# Patient Record
Sex: Female | Born: 1965 | Race: White | Hispanic: No | Marital: Married | Smoking: Never smoker
Health system: Southern US, Community
[De-identification: ages and names within clinical notes are randomized; demographics above are authoritative.]

## PROBLEM LIST (undated history)

## (undated) DIAGNOSIS — Z923 Personal history of irradiation: Secondary | ICD-10-CM

## (undated) DIAGNOSIS — H16003 Unspecified corneal ulcer, bilateral: Secondary | ICD-10-CM

## (undated) DIAGNOSIS — M1990 Unspecified osteoarthritis, unspecified site: Secondary | ICD-10-CM

## (undated) DIAGNOSIS — R7989 Other specified abnormal findings of blood chemistry: Secondary | ICD-10-CM

## (undated) DIAGNOSIS — Z87442 Personal history of urinary calculi: Secondary | ICD-10-CM

## (undated) HISTORY — PX: CHOLECYSTECTOMY: SHX55

## (undated) HISTORY — PX: WISDOM TOOTH EXTRACTION: SHX21

## (undated) HISTORY — PX: ABDOMINAL SURGERY: SHX537

---

## 1997-10-18 ENCOUNTER — Inpatient Hospital Stay (HOSPITAL_COMMUNITY): Admission: AD | Admit: 1997-10-18 | Discharge: 1997-10-18 | Payer: Self-pay | Admitting: Obstetrics and Gynecology

## 1997-10-19 ENCOUNTER — Inpatient Hospital Stay (HOSPITAL_COMMUNITY): Admission: AD | Admit: 1997-10-19 | Discharge: 1997-10-21 | Payer: Self-pay | Admitting: *Deleted

## 1997-12-01 ENCOUNTER — Other Ambulatory Visit: Admission: RE | Admit: 1997-12-01 | Discharge: 1997-12-01 | Payer: Self-pay | Admitting: *Deleted

## 1998-12-06 ENCOUNTER — Other Ambulatory Visit: Admission: RE | Admit: 1998-12-06 | Discharge: 1998-12-06 | Payer: Self-pay | Admitting: Obstetrics and Gynecology

## 1999-12-18 ENCOUNTER — Other Ambulatory Visit: Admission: RE | Admit: 1999-12-18 | Discharge: 1999-12-18 | Payer: Self-pay | Admitting: *Deleted

## 2001-01-16 ENCOUNTER — Other Ambulatory Visit: Admission: RE | Admit: 2001-01-16 | Discharge: 2001-01-16 | Payer: Self-pay | Admitting: Obstetrics & Gynecology

## 2002-02-27 ENCOUNTER — Other Ambulatory Visit: Admission: RE | Admit: 2002-02-27 | Discharge: 2002-02-27 | Payer: Self-pay | Admitting: Obstetrics & Gynecology

## 2003-04-07 ENCOUNTER — Other Ambulatory Visit: Admission: RE | Admit: 2003-04-07 | Discharge: 2003-04-07 | Payer: Self-pay | Admitting: Obstetrics & Gynecology

## 2004-04-25 ENCOUNTER — Other Ambulatory Visit: Admission: RE | Admit: 2004-04-25 | Discharge: 2004-04-25 | Payer: Self-pay | Admitting: Obstetrics & Gynecology

## 2005-06-14 ENCOUNTER — Other Ambulatory Visit: Admission: RE | Admit: 2005-06-14 | Discharge: 2005-06-14 | Payer: Self-pay | Admitting: Obstetrics & Gynecology

## 2011-12-04 ENCOUNTER — Other Ambulatory Visit: Payer: Self-pay | Admitting: Obstetrics & Gynecology

## 2011-12-04 DIAGNOSIS — R928 Other abnormal and inconclusive findings on diagnostic imaging of breast: Secondary | ICD-10-CM

## 2011-12-11 ENCOUNTER — Ambulatory Visit
Admission: RE | Admit: 2011-12-11 | Discharge: 2011-12-11 | Disposition: A | Discharge status: Home / Self Care | Payer: BC Managed Care – PPO | Source: Ambulatory Visit | Attending: Obstetrics & Gynecology | Admitting: Obstetrics & Gynecology

## 2011-12-11 DIAGNOSIS — R928 Other abnormal and inconclusive findings on diagnostic imaging of breast: Secondary | ICD-10-CM

## 2017-01-01 ENCOUNTER — Other Ambulatory Visit: Payer: Self-pay | Admitting: Obstetrics & Gynecology

## 2017-01-01 DIAGNOSIS — R928 Other abnormal and inconclusive findings on diagnostic imaging of breast: Secondary | ICD-10-CM

## 2017-01-04 ENCOUNTER — Other Ambulatory Visit: Payer: Self-pay

## 2017-01-04 ENCOUNTER — Ambulatory Visit
Admission: RE | Admit: 2017-01-04 | Discharge: 2017-01-04 | Disposition: A | Discharge status: Home / Self Care | Payer: BLUE CROSS/BLUE SHIELD | Source: Ambulatory Visit | Attending: Obstetrics & Gynecology | Admitting: Obstetrics & Gynecology

## 2017-01-04 DIAGNOSIS — R928 Other abnormal and inconclusive findings on diagnostic imaging of breast: Secondary | ICD-10-CM

## 2018-11-02 ENCOUNTER — Encounter (HOSPITAL_COMMUNITY): Payer: Self-pay | Admitting: *Deleted

## 2018-11-02 ENCOUNTER — Other Ambulatory Visit: Payer: Self-pay

## 2018-11-02 ENCOUNTER — Emergency Department (HOSPITAL_COMMUNITY)
Admission: EM | Admit: 2018-11-02 | Discharge: 2018-11-02 | Disposition: A | Discharge status: Home / Self Care | Payer: Commercial Managed Care - PPO | Attending: Emergency Medicine | Admitting: Emergency Medicine

## 2018-11-02 ENCOUNTER — Emergency Department (HOSPITAL_COMMUNITY): Payer: Commercial Managed Care - PPO

## 2018-11-02 DIAGNOSIS — Z79899 Other long term (current) drug therapy: Secondary | ICD-10-CM | POA: Diagnosis not present

## 2018-11-02 DIAGNOSIS — N201 Calculus of ureter: Secondary | ICD-10-CM | POA: Diagnosis not present

## 2018-11-02 DIAGNOSIS — R1031 Right lower quadrant pain: Secondary | ICD-10-CM | POA: Diagnosis present

## 2018-11-02 DIAGNOSIS — N23 Unspecified renal colic: Secondary | ICD-10-CM

## 2018-11-02 LAB — CBC WITH DIFFERENTIAL/PLATELET
Abs Immature Granulocytes: 0.03 10*3/uL (ref 0.00–0.07)
Basophils Absolute: 0 10*3/uL (ref 0.0–0.1)
Basophils Relative: 0 %
Eosinophils Absolute: 0 10*3/uL (ref 0.0–0.5)
Eosinophils Relative: 0 %
HCT: 42.2 % (ref 36.0–46.0)
Hemoglobin: 13.8 g/dL (ref 12.0–15.0)
Immature Granulocytes: 0 %
Lymphocytes Relative: 11 %
Lymphs Abs: 1.3 10*3/uL (ref 0.7–4.0)
MCH: 30.1 pg (ref 26.0–34.0)
MCHC: 32.7 g/dL (ref 30.0–36.0)
MCV: 92.1 fL (ref 80.0–100.0)
Monocytes Absolute: 0.5 10*3/uL (ref 0.1–1.0)
Monocytes Relative: 4 %
Neutro Abs: 9.8 10*3/uL — ABNORMAL HIGH (ref 1.7–7.7)
Neutrophils Relative %: 85 %
Platelets: 205 10*3/uL (ref 150–400)
RBC: 4.58 MIL/uL (ref 3.87–5.11)
RDW: 12.4 % (ref 11.5–15.5)
WBC: 11.7 10*3/uL — ABNORMAL HIGH (ref 4.0–10.5)
nRBC: 0 % (ref 0.0–0.2)

## 2018-11-02 LAB — COMPREHENSIVE METABOLIC PANEL
ALT: 16 U/L (ref 0–44)
AST: 23 U/L (ref 15–41)
Albumin: 4.5 g/dL (ref 3.5–5.0)
Alkaline Phosphatase: 40 U/L (ref 38–126)
Anion gap: 10 (ref 5–15)
BUN: 16 mg/dL (ref 6–20)
CO2: 24 mmol/L (ref 22–32)
Calcium: 9.3 mg/dL (ref 8.9–10.3)
Chloride: 106 mmol/L (ref 98–111)
Creatinine, Ser: 1.17 mg/dL — ABNORMAL HIGH (ref 0.44–1.00)
GFR calc Af Amer: >60 mL/min (ref >60)
GFR calc non Af Amer: 54 mL/min — ABNORMAL LOW (ref >60)
Glucose, Bld: 144 mg/dL — ABNORMAL HIGH (ref 70–99)
Potassium: 3.6 mmol/L (ref 3.5–5.1)
Sodium: 140 mmol/L (ref 135–145)
Total Bilirubin: 0.4 mg/dL (ref 0.3–1.2)
Total Protein: 7.1 g/dL (ref 6.5–8.1)

## 2018-11-02 LAB — URINALYSIS, ROUTINE W REFLEX MICROSCOPIC
Bilirubin Urine: NEGATIVE
Glucose, UA: NEGATIVE mg/dL
Hgb urine dipstick: NEGATIVE
Ketones, ur: 5 mg/dL — AB
Leukocytes,Ua: NEGATIVE
Nitrite: NEGATIVE
Protein, ur: NEGATIVE mg/dL
Specific Gravity, Urine: 1.008 (ref 1.005–1.030)
pH: 6 (ref 5.0–8.0)

## 2018-11-02 LAB — I-STAT BETA HCG BLOOD, ED (MC, WL, AP ONLY): I-stat hCG, quantitative: <5 m[IU]/mL (ref <5)

## 2018-11-02 MED ORDER — KETOROLAC TROMETHAMINE 30 MG/ML IJ SOLN
30.0000 mg | Freq: Once | INTRAMUSCULAR | Status: AC
  Administered 2018-11-02: 04:00:00 30 mg via INTRAVENOUS
  Filled 2018-11-02: qty 1

## 2018-11-02 MED ORDER — ONDANSETRON HCL 4 MG/2ML IJ SOLN
4.0000 mg | Freq: Once | INTRAMUSCULAR | Status: AC
  Administered 2018-11-02: 02:00:00 4 mg via INTRAVENOUS
  Filled 2018-11-02: qty 2

## 2018-11-02 MED ORDER — HYDROCODONE-ACETAMINOPHEN 5-325 MG PO TABS: 1.0000 | ORAL_TABLET | Freq: Four times a day (QID) | ORAL | 0 refills | Status: DC | PRN

## 2018-11-02 MED ORDER — SODIUM CHLORIDE 0.9 % IV BOLUS (SEPSIS)
1000.0000 mL | Freq: Once | INTRAVENOUS | Status: AC
  Administered 2018-11-02: 1000 mL via INTRAVENOUS

## 2018-11-02 MED ORDER — FENTANYL CITRATE (PF) 100 MCG/2ML IJ SOLN
100.0000 ug | Freq: Once | INTRAMUSCULAR | Status: AC
  Administered 2018-11-02: 02:00:00 100 ug via INTRAVENOUS
  Filled 2018-11-02: qty 2

## 2018-11-02 NOTE — ED Provider Notes (Signed)
[facility] EMERGENCY DEPARTMENT Provider Note   CSN: [id number] Arrival date & time: 11/02/18  0120    History   Chief Complaint Chief Complaint  Patient presents with   Flank Pain    HPI Jennifer Cantu is a 53 y.o. female.     The history is provided by the patient.  Flank Pain  This is a new problem. The problem occurs constantly. The problem has been rapidly worsening. Pertinent negatives include no chest pain and no abdominal pain. Exacerbated by: position. Nothing relieves the symptoms. She has tried acetaminophen for the symptoms. The treatment provided no relief.  PT Presents with acute onset of right flank pain that started several hours ago.  Denies trauma.  She does not recall having this pain previously.  No fevers.  No chest pain or cough.  No Urinary symptoms reported   PMH-none Past Surgical History:  Procedure Laterality Date   ABDOMINAL SURGERY       OB History   No obstetric history on file.      Home Medications    Prior to Admission medications   Medication Sig Start Date End Date Taking? Authorizing Provider  ALPRAZolam Duanne Moron) 0.5 MG tablet  08/21/18   [provider]  PREMARIN 0.9 MG tablet  10/14/18   [provider]    Family History No family history on file.  Social History Social History   Tobacco Use   Smoking status: Never Smoker   Smokeless tobacco: Never Used  Substance Use Topics   Alcohol use: Not on file   Drug use: Not on file     Allergies   Patient has no known allergies.   Review of Systems Review of Systems  Constitutional: Negative for fever.  Cardiovascular: Negative for chest pain.  Gastrointestinal: Negative for abdominal pain.  Genitourinary: Positive for flank pain.  All other systems reviewed and are negative.    Physical Exam Updated Vital Signs BP 135/90    Pulse 85    Temp 97.7 F (36.5 C) (Oral)    Resp 20    Ht 1.626 m (5\' 4" )    Wt 56.2 kg    SpO2 100%    BMI 21.28  kg/m   Physical Exam  CONSTITUTIONAL: Well developed/well nourished, uncomfortable appearing  HEAD: Normocephalic/atraumatic EYES: EOMI/PERRL ENMT: Mucous membranes moist NECK: supple no meningeal signs SPINE/BACK:entire spine nontender CV: S1/S2 noted, no murmurs/rubs/gallops noted LUNGS: Lungs are clear to auscultation bilaterally, no apparent distress ABDOMEN: soft, nontender, no rebound or guarding, bowel sounds noted throughout abdomen LK:GMWNU cva tenderness NEURO: Pt is awake/alert/appropriate, moves all extremitiesx4.  No facial droop.   EXTREMITIES: pulses normal/equal, full ROM SKIN: warm, color normal PSYCH: no abnormalities of mood noted, alert and oriented to situation  ED Treatments / Results  Labs (all labs ordered are listed, but only abnormal results are displayed) Labs Reviewed  URINALYSIS, ROUTINE W REFLEX MICROSCOPIC - Abnormal; Notable for the following components:      Result Value   Color, Urine STRAW (*)    Ketones, ur 5 (*)    All other components within normal limits  CBC WITH DIFFERENTIAL/PLATELET - Abnormal; Notable for the following components:   WBC 11.7 (*)    Neutro Abs 9.8 (*)    All other components within normal limits  COMPREHENSIVE METABOLIC PANEL - Abnormal; Notable for the following components:   Glucose, Bld 144 (*)    Creatinine, Ser 1.17 (*)    GFR calc non Af Amer 54 (*)  All other components within normal limits  I-STAT BETA HCG BLOOD, ED ([facility], [facility], [facility] ONLY)    EKG None  Radiology Ct Renal Stone Study  Result Date: 11/02/2018 CLINICAL DATA:  Right side flank pain. EXAM: CT ABDOMEN AND PELVIS WITHOUT CONTRAST TECHNIQUE: Multidetector CT imaging of the abdomen and pelvis was performed following the standard protocol without IV contrast. COMPARISON:  None. FINDINGS: Lower chest: Lung bases are clear. No effusions. Heart is normal size. Hepatobiliary: 1.5 cm cyst in the left hepatic lobe. Prior cholecystectomy. Pancreas: No focal  abnormality or ductal dilatation. Spleen: No focal abnormality.  Normal size. Adrenals/Urinary Tract: Mild right hydronephrosis and hydroureter due to 2-3 mm distal right ureteral stone. No stones or hydronephrosis on the left. Adrenal glands and urinary bladder unremarkable. Stomach/Bowel: Normal appendix. Stomach, large and small bowel grossly unremarkable. Vascular/Lymphatic: No evidence of aneurysm or adenopathy. Reproductive: IUD noted in the uterus. Uterus and adnexa unremarkable. No mass. Other: No free fluid or free air. Musculoskeletal: No acute bony abnormality. IMPRESSION: 2-3 mm distal right ureteral stone with mild right hydronephrosis. Electronically Signed   By: Rolm Baptise M.D.   On: 11/02/2018 03:37    Procedures Procedures    Medications Ordered in ED Medications  fentaNYL (SUBLIMAZE) injection 100 mcg (100 mcg Intravenous Given 11/02/18 0228)  ondansetron (ZOFRAN) injection 4 mg (4 mg Intravenous Given 11/02/18 0228)  sodium chloride 0.9 % bolus 1,000 mL (0 mLs Intravenous Stopped 11/02/18 0346)  ketorolac (TORADOL) 30 MG/ML injection 30 mg (30 mg Intravenous Given 11/02/18 0409)     Initial Impression / Assessment and Plan / ED Course  I have reviewed the triage vital signs and the nursing notes.  Pertinent labs results that were available during my care of the patient were reviewed by me and considered in my medical decision making (see chart for details).        3:04 AM PT Presents for right flank pain.  She did not respond to IV pain medicines.  Will obtain CT imaging to evaluate for ureteral stone 5:10 AM PT Improved.  Found to have a right ureteral stone.  She responded to Toradol.  She is requesting discharge home. She declines pain medications.  She will try ibuprofen at home  Final Clinical Impressions(s) / ED Diagnoses   Final diagnoses:  Ureteral colic    ED Discharge Orders    None       Ripley Fraise, MD 11/02/18 [phone]

## 2018-11-02 NOTE — ED Triage Notes (Signed)
Pt c/o right side flank pain with n/v that started yesterday, denies any urinary symptoms,

## 2018-11-24 ENCOUNTER — Other Ambulatory Visit: Payer: Self-pay | Admitting: Urology

## 2018-11-24 ENCOUNTER — Other Ambulatory Visit (HOSPITAL_COMMUNITY): Payer: Commercial Managed Care - PPO

## 2018-11-24 ENCOUNTER — Other Ambulatory Visit: Payer: Self-pay

## 2018-11-24 ENCOUNTER — Encounter (HOSPITAL_COMMUNITY)
Admission: RE | Admit: 2018-11-24 | Discharge: 2018-11-24 | Disposition: A | Discharge status: Home / Self Care | Payer: Commercial Managed Care - PPO | Source: Ambulatory Visit

## 2018-11-24 ENCOUNTER — Other Ambulatory Visit (HOSPITAL_COMMUNITY)
Admission: RE | Admit: 2018-11-24 | Discharge: 2018-11-24 | Disposition: A | Discharge status: Home / Self Care | Payer: Commercial Managed Care - PPO | Source: Ambulatory Visit | Attending: Urology | Admitting: Urology

## 2018-11-24 ENCOUNTER — Encounter (HOSPITAL_COMMUNITY): Payer: Self-pay

## 2018-11-24 DIAGNOSIS — Z1159 Encounter for screening for other viral diseases: Secondary | ICD-10-CM | POA: Insufficient documentation

## 2018-11-24 DIAGNOSIS — Z79899 Other long term (current) drug therapy: Secondary | ICD-10-CM | POA: Diagnosis not present

## 2018-11-24 DIAGNOSIS — Z01812 Encounter for preprocedural laboratory examination: Secondary | ICD-10-CM | POA: Insufficient documentation

## 2018-11-24 DIAGNOSIS — N132 Hydronephrosis with renal and ureteral calculous obstruction: Secondary | ICD-10-CM | POA: Diagnosis present

## 2018-11-24 DIAGNOSIS — N3289 Other specified disorders of bladder: Secondary | ICD-10-CM | POA: Diagnosis not present

## 2018-11-24 HISTORY — DX: Other specified abnormal findings of blood chemistry: R79.89

## 2018-11-24 HISTORY — DX: Unspecified corneal ulcer, bilateral: H16.003

## 2018-11-24 HISTORY — DX: Personal history of urinary calculi: Z87.442

## 2018-11-24 HISTORY — DX: Unspecified osteoarthritis, unspecified site: M19.90

## 2018-11-24 LAB — BASIC METABOLIC PANEL
Anion gap: 7 (ref 5–15)
BUN: 13 mg/dL (ref 6–20)
CO2: 28 mmol/L (ref 22–32)
Calcium: 8.9 mg/dL (ref 8.9–10.3)
Chloride: 103 mmol/L (ref 98–111)
Creatinine, Ser: 1.27 mg/dL — ABNORMAL HIGH (ref 0.44–1.00)
GFR calc Af Amer: 57 mL/min — ABNORMAL LOW (ref >60)
GFR calc non Af Amer: 49 mL/min — ABNORMAL LOW (ref >60)
Glucose, Bld: 97 mg/dL (ref 70–99)
Potassium: 4.5 mmol/L (ref 3.5–5.1)
Sodium: 138 mmol/L (ref 135–145)

## 2018-11-24 LAB — CBC
HCT: 44.4 % (ref 36.0–46.0)
Hemoglobin: 14.5 g/dL (ref 12.0–15.0)
MCH: 30.4 pg (ref 26.0–34.0)
MCHC: 32.7 g/dL (ref 30.0–36.0)
MCV: 93.1 fL (ref 80.0–100.0)
Platelets: 214 10*3/uL (ref 150–400)
RBC: 4.77 MIL/uL (ref 3.87–5.11)
RDW: 12.2 % (ref 11.5–15.5)
WBC: 10.5 10*3/uL (ref 4.0–10.5)
nRBC: 0 % (ref 0.0–0.2)

## 2018-11-24 LAB — HCG, SERUM, QUALITATIVE: Preg, Serum: NEGATIVE

## 2018-11-24 LAB — SARS CORONAVIRUS 2 BY RT PCR (HOSPITAL ORDER, PERFORMED IN ~~LOC~~ HOSPITAL LAB): SARS Coronavirus 2: NEGATIVE

## 2018-11-24 NOTE — Progress Notes (Signed)
Called pharmacy due to surgery schedule for 11-25-2018 saying medication reconcilitation in process, hospital pharmcy stated med rec was done and pharmacy tech did not need to come

## 2018-11-24 NOTE — H&P (Signed)
CC: I have a ureteral stone.  HPI: Jennifer Cantu is a 53 year-old female patient who is here for a ureteral calculus.  The patient's stone was on her right side. She first noticed the symptoms 11/02/2018.   11/24/18: The patient was seen in the emergency room on 11/02/18 with progressively worsening flank pain with no radiation. It was worsened by positional change but not improved and was located on the right side having started just prior to being seen. She reported no urinary symptoms. A CT scan revealed a 2 mm wide stone just above the intramural ureter on the right-hand side with no right or left renal calculi. She returns now with worsening symptoms since Friday morning. The pain has been intermittent and was bad for the last 24 hours. She has nausea with vomiting. She has had no hematuria. She had some urgency last night with small voids. She has no fever.      ALLERGIES: No Known Drug Allergies    MEDICATIONS: ALPRAZOLAM PO Daily  Hydrocodone-Acetaminophen  PREMARIN PO Daily     GU PSH: None   NON-GU PSH: Cholecystectomy (laparoscopic)    GU PMH: None   NON-GU PMH: None   FAMILY HISTORY: 1 son - Other Diabetes - Cousin Heart Disease - Mother High Blood Pressure - Mother   SOCIAL HISTORY: Marital Status: Married Preferred Language: English; Race: White Current Smoking Status: Patient has never smoked.  Does not use smokeless tobacco. Has never drank.  Does not use drugs. Drinks 1 caffeinated drink per day.    REVIEW OF SYSTEMS:    GU Review Female:   Patient reports frequent urination, get up at night to urinate, and stream starts and stops. Patient denies leakage of urine, hard to postpone urination, have to strain to urinate, being pregnant, trouble starting your stream, and burning /pain with urination.  Gastrointestinal (Upper):   Patient reports nausea and vomiting. Patient denies indigestion/ heartburn.  Gastrointestinal (Lower):   Patient reports constipation.  Patient denies diarrhea.  Constitutional:   Patient denies fever, night sweats, weight loss, and fatigue.  Skin:   Patient denies skin rash/ lesion and itching.  Eyes:   Patient denies blurred vision and double vision.  Ears/ Nose/ Throat:   Patient denies sore throat and sinus problems.  Hematologic/Lymphatic:   Patient denies swollen glands and easy bruising.  Cardiovascular:   Patient denies leg swelling and chest pains.  Respiratory:   Patient denies cough and shortness of breath.  Endocrine:   Patient denies excessive thirst.  Musculoskeletal:   Patient reports back pain. Patient denies joint pain.  Neurological:   Patient reports headaches. Patient denies dizziness.  Psychologic:   Patient denies depression and anxiety.   Notes: hematuria weak stream    VITAL SIGNS:      11/24/2018 10:13 AM  Weight 123 lb / 55.79 kg  Height 64 in / 162.56 cm  BP 114/71 mmHg  Heart Rate 72 /min  Temperature 98.1 F / 36.7 C  BMI 21.1 kg/m   MULTI-SYSTEM PHYSICAL EXAMINATION:    Constitutional: Well-nourished. No physical deformities. Normally developed. Good grooming.  Neck: Neck symmetrical, not swollen. Normal tracheal position.  Respiratory: Normal breath sounds. No labored breathing, no use of accessory muscles.   Cardiovascular: Regular rate and rhythm. No murmur, no gallop.   Lymphatic: No enlargement, no tenderness of supraclavicular or, neck lymph nodes.  Skin: No paleness, no jaundice, no cyanosis. No lesion, no ulcer, no rash.  Neurologic / Psychiatric: Oriented to time,  oriented to place, oriented to person. No depression, no anxiety, no agitation.  Gastrointestinal: Abdominal tenderness in RLQ and CVA. No mass, no rigidity, non obese abdomen.   Musculoskeletal: Normal gait and station of head and neck.     PAST DATA REVIEWED:  Source Of History:  Patient, Outside Source  Lab Test Review:   BUN/Creatinine, Calcium  Records Review:   Previous Hospital Records  Urine Test  Review:   Urinalysis  X-Ray Review: C.T. Abdomen/Pelvis: Reviewed Films. Reviewed Report. Her CT scan revealed mild right hydronephrosis.   Notes:                     On 11/02/18 her creatinine was noted to be 1.17, her serum calcium was normal at 9.3 and her urinalysis was clear.   PROCEDURES:         KUB - [id number]  A single view of the abdomen is obtained.      Patient confirmed No Neulasta OnPro Device.           Urinalysis w/Scope Dipstick Dipstick Cont'd Micro  Color: Yellow Bilirubin: Neg mg/dL WBC/hpf: 0 - 5/hpf  Appearance: Clear Ketones: Trace mg/dL RBC/hpf: 3 - 10/hpf  Specific Gravity: 1.020 Blood: 2+ ery/uL Bacteria: NS (Not Seen)  pH: 6.0 Protein: Neg mg/dL Cystals: NS (Not Seen)  Glucose: Neg mg/dL Urobilinogen: 0.2 mg/dL Casts: NS (Not Seen)    Nitrites: Neg Trichomonas: Not Present    Leukocyte Esterase: Trace leu/uL Mucous: Not Present      Epithelial Cells: 0 - 5/hpf      Yeast: NS (Not Seen)      Sperm: Not Present         Ketoralac 17m - [id number]: 30 Adm. By: BRobynn Pane Unit: mg Lot No [id number] Route: IM Exp. Date 12/08/2019  Freq: None Mfgr.:   Site: Right Buttock   ASSESSMENT:      ICD-10 Details  1 GU:   Ureteral calculus - N20.1 Right, She has a 2-399mright distal stone with no progression over 3 weeks. I discussed continued MET with the addition of tamsulosin vs URS. I think the stone is too small to consider ESWL. She got relief with the Toradol so I will get her on the schedule tomorrow for URS. I have reviewed the risks of ureteroscopy including bleeding, infection, ureteral injury, need for a stent or secondary procedures, thrombotic events and anesthetic complications.    PLAN:           Orders X-Rays: KUB          [facility]: [facility] [phone] Ketoralac 3016mToradol Per 15 Mg) - [id number]       Document Letter(s):  Created for Patient: Clinical Summary         Notes:    CC: Dr. KevRory Percy     Next Appointment:      Next Appointment: 11/25/2018 01:30 PM    Appointment Type: Surgery     Location: [facility] - [phone] Provider: JohIrine Seal.D.    Reason for Visit: OP WL CYSTO RT RTG URS POSS HLL STENT

## 2018-11-24 NOTE — Patient Instructions (Addendum)
Jennifer Cantu    Your procedure is scheduled on: 11-25-2018  Report to [place]  Entrance  Report to admitting at [place] 19 TEST ON TODAY.  Call this number if you have problems the morning of surgery [phone]    Remember: Do not eat food  :After Midnight. CLEAR LIQUIDS FROM MIDNIGHT UNTIL 730 AM. NOTHING BY MOUTH AFTER 730 AM.  BRUSH YOUR TEETH MORNING OF SURGERY AND RINSE YOUR MOUTH OUT, NO CHEWING GUM CANDY OR MINTS.     CLEAR LIQUID DIET   Foods Allowed                                                                     Foods Excluded  Coffee and tea, regular and decaf                             liquids that you cannot  Plain Jell-O in any flavor                                             see through such as: Fruit ices (not with fruit pulp)                                     milk, soups, orange juice  Iced Popsicles                                    All solid food Carbonated beverages, regular and diet                                    Cranberry, grape and apple juices Sports drinks like Gatorade Lightly seasoned clear broth or consume(fat free) Sugar, honey syrup  Sample Menu Breakfast                                Lunch                                     Supper Cranberry juice                    Beef broth                            Chicken broth Jell-O                                     Grape juice  Apple juice Coffee or tea                        Jell-O                                      Popsicle                                                Coffee or tea                        Coffee or tea  _____________________________________________________________________     Take these medicines the morning of surgery with A SIP OF WATER: HYDROCODONE IF NEEDED, EYE DROP              You may not have any metal on your body including hair pins and              piercings  Do not  wear jewelry, make-up, lotions, powders or perfumes, deodorant             Do not wear nail polish.  Do not shave  48 hours prior to surgery.           .   Do not bring valuables to the hospital. [place].  Contacts, dentures or bridgework may not be worn into surgery.  Leave suitcase in the car. After surgery it may be brought to your room.     Patients discharged the day of surgery will not be allowed to drive home. IF YOU ARE HAVING SURGERY AND GOING HOME THE SAME DAY, YOU MUST HAVE AN ADULT TO DRIVE YOU HOME AND BE WITH YOU FOR 24 HOURS. YOU MAY GO HOME BY TAXI OR UBER OR ORTHERWISE, BUT AN ADULT MUST ACCOMPANY YOU HOME AND STAY WITH YOU FOR 24 HOURS.  Name and phone number of your driver:BRIAN LAJUANNA POMPA CELL [phone]  Special Instructions: N/A              Please read over the following fact sheets you were given: _____________________________________________________________________             [facility] - Preparing for Surgery Before surgery, you can play an important role.  Because skin is not sterile, your skin needs to be as free of germs as possible.  You can reduce the number of germs on your skin by washing with CHG (chlorahexidine gluconate) soap before surgery.  CHG is an antiseptic cleaner which kills germs and bonds with the skin to continue killing germs even after washing. Please DO NOT use if you have an allergy to CHG or antibacterial soaps.  If your skin becomes reddened/irritated stop using the CHG and inform your nurse when you arrive at Short Stay. Do not shave (including legs and underarms) for at least 48 hours prior to the first CHG shower.  You may shave your face/neck. Please follow these instructions carefully:  1.  Shower with CHG Soap the night before surgery and the  morning of Surgery.  2.  If you choose to wash your hair, wash your hair first as usual  with your  normal  shampoo.  3.  After you  shampoo, rinse your hair and body thoroughly to remove the  shampoo.                           4.  Use CHG as you would any other liquid soap.  You can apply chg directly  to the skin and wash                       Gently with a scrungie or clean washcloth.  5.  Apply the CHG Soap to your body ONLY FROM THE NECK DOWN.   Do not use on face/ open                           Wound or open sores. Avoid contact with eyes, ears mouth and genitals (private parts).                       Wash face,  Genitals (private parts) with your normal soap.             6.  Wash thoroughly, paying special attention to the area where your surgery  will be performed.  7.  Thoroughly rinse your body with warm water from the neck down.  8.  DO NOT shower/wash with your normal soap after using and rinsing off  the CHG Soap.                9.  Pat yourself dry with a clean towel.            10.  Wear clean pajamas.            11.  Place clean sheets on your bed the night of your first shower and do not  sleep with pets. Day of Surgery : Do not apply any lotions/deodorants the morning of surgery.  Please wear clean clothes to the hospital/surgery center.  FAILURE TO FOLLOW THESE INSTRUCTIONS MAY RESULT IN THE CANCELLATION OF YOUR SURGERY PATIENT SIGNATURE_________________________________  NURSE SIGNATURE__________________________________  ________________________________________________________________________

## 2018-11-25 ENCOUNTER — Ambulatory Visit (HOSPITAL_COMMUNITY): Payer: Commercial Managed Care - PPO

## 2018-11-25 ENCOUNTER — Encounter (HOSPITAL_COMMUNITY)
Admission: RE | Disposition: A | Discharge status: Home / Self Care | Payer: Self-pay | Source: Home / Self Care | Attending: Urology

## 2018-11-25 ENCOUNTER — Ambulatory Visit (HOSPITAL_COMMUNITY)
Admission: RE | Admit: 2018-11-25 | Discharge: 2018-11-25 | Disposition: A | Discharge status: Home / Self Care | Payer: Commercial Managed Care - PPO | Attending: Urology | Admitting: Urology

## 2018-11-25 ENCOUNTER — Encounter (HOSPITAL_COMMUNITY): Payer: Self-pay | Admitting: *Deleted

## 2018-11-25 ENCOUNTER — Ambulatory Visit (HOSPITAL_COMMUNITY): Payer: Commercial Managed Care - PPO | Admitting: Anesthesiology

## 2018-11-25 ENCOUNTER — Ambulatory Visit (HOSPITAL_COMMUNITY): Payer: Commercial Managed Care - PPO | Admitting: Physician Assistant

## 2018-11-25 DIAGNOSIS — N132 Hydronephrosis with renal and ureteral calculous obstruction: Secondary | ICD-10-CM | POA: Diagnosis not present

## 2018-11-25 DIAGNOSIS — N201 Calculus of ureter: Secondary | ICD-10-CM

## 2018-11-25 DIAGNOSIS — Z79899 Other long term (current) drug therapy: Secondary | ICD-10-CM | POA: Insufficient documentation

## 2018-11-25 DIAGNOSIS — N3289 Other specified disorders of bladder: Secondary | ICD-10-CM | POA: Insufficient documentation

## 2018-11-25 DIAGNOSIS — Z1159 Encounter for screening for other viral diseases: Secondary | ICD-10-CM | POA: Insufficient documentation

## 2018-11-25 HISTORY — PX: CYSTOSCOPY WITH RETROGRADE PYELOGRAM, URETEROSCOPY AND STENT PLACEMENT: SHX5789

## 2018-11-25 SURGERY — CYSTOURETEROSCOPY, WITH RETROGRADE PYELOGRAM AND STENT INSERTION
Anesthesia: General | Laterality: Right

## 2018-11-25 MED ORDER — ONDANSETRON HCL 4 MG/2ML IJ SOLN
INTRAMUSCULAR | Status: DC | PRN
  Administered 2018-11-25: 4 mg via INTRAVENOUS

## 2018-11-25 MED ORDER — DEXAMETHASONE SODIUM PHOSPHATE 10 MG/ML IJ SOLN
INTRAMUSCULAR | Status: AC
  Filled 2018-11-25: qty 1

## 2018-11-25 MED ORDER — IOHEXOL 300 MG/ML  SOLN
INTRAMUSCULAR | Status: DC | PRN
  Administered 2018-11-25: 14:00:00 1 mL

## 2018-11-25 MED ORDER — ONDANSETRON HCL 4 MG/2ML IJ SOLN
INTRAMUSCULAR | Status: AC
  Filled 2018-11-25: qty 2

## 2018-11-25 MED ORDER — 0.9 % SODIUM CHLORIDE (POUR BTL) OPTIME
TOPICAL | Status: DC | PRN
  Administered 2018-11-25: 14:00:00 1000 mL

## 2018-11-25 MED ORDER — KETOROLAC TROMETHAMINE 30 MG/ML IJ SOLN
INTRAMUSCULAR | Status: AC
  Filled 2018-11-25: qty 1

## 2018-11-25 MED ORDER — DEXAMETHASONE SODIUM PHOSPHATE 10 MG/ML IJ SOLN
INTRAMUSCULAR | Status: DC | PRN
  Administered 2018-11-25: 10 mg via INTRAVENOUS

## 2018-11-25 MED ORDER — SODIUM CHLORIDE 0.9% FLUSH: 3.0000 mL | Freq: Two times a day (BID) | INTRAVENOUS | Status: DC

## 2018-11-25 MED ORDER — LACTATED RINGERS IV SOLN
INTRAVENOUS | Status: DC | PRN
  Administered 2018-11-25: 13:00:00 via INTRAVENOUS

## 2018-11-25 MED ORDER — KETOROLAC TROMETHAMINE 30 MG/ML IJ SOLN
INTRAMUSCULAR | Status: DC | PRN
  Administered 2018-11-25: 30 mg via INTRAVENOUS

## 2018-11-25 MED ORDER — FENTANYL CITRATE (PF) 100 MCG/2ML IJ SOLN
INTRAMUSCULAR | Status: AC
  Filled 2018-11-25: qty 2

## 2018-11-25 MED ORDER — FENTANYL CITRATE (PF) 100 MCG/2ML IJ SOLN
INTRAMUSCULAR | Status: DC | PRN
  Administered 2018-11-25 (×2): 50 ug via INTRAVENOUS

## 2018-11-25 MED ORDER — SODIUM CHLORIDE 0.9 % IR SOLN
Status: DC | PRN
  Administered 2018-11-25: 3000 mL

## 2018-11-25 MED ORDER — KETOROLAC TROMETHAMINE 30 MG/ML IJ SOLN: 30.0000 mg | Freq: Once | INTRAMUSCULAR | Status: DC | PRN

## 2018-11-25 MED ORDER — PROPOFOL 10 MG/ML IV BOLUS
INTRAVENOUS | Status: AC
  Filled 2018-11-25: qty 20

## 2018-11-25 MED ORDER — FENTANYL CITRATE (PF) 100 MCG/2ML IJ SOLN: 25.0000 ug | INTRAMUSCULAR | Status: DC | PRN

## 2018-11-25 MED ORDER — LIDOCAINE 2% (20 MG/ML) 5 ML SYRINGE
INTRAMUSCULAR | Status: DC | PRN
  Administered 2018-11-25: 80 mg via INTRAVENOUS

## 2018-11-25 MED ORDER — PROPOFOL 10 MG/ML IV BOLUS
INTRAVENOUS | Status: DC | PRN
  Administered 2018-11-25: 160 mg via INTRAVENOUS

## 2018-11-25 MED ORDER — PROMETHAZINE HCL 25 MG/ML IJ SOLN: 6.2500 mg | INTRAMUSCULAR | Status: DC | PRN

## 2018-11-25 MED ORDER — CEFAZOLIN SODIUM-DEXTROSE 2-4 GM/100ML-% IV SOLN
2.0000 g | INTRAVENOUS | Status: AC
  Administered 2018-11-25: 14:00:00 2 g via INTRAVENOUS
  Filled 2018-11-25: qty 100

## 2018-11-25 SURGICAL SUPPLY — 22 items
BAG URO CATCHER STRL LF (MISCELLANEOUS) ×3 IMPLANT
BASKET STONE NCOMPASS (UROLOGICAL SUPPLIES) IMPLANT
CATH URET 5FR 28IN OPEN ENDED (CATHETERS) ×2 IMPLANT
CATH URET DUAL LUMEN 6-10FR 50 (CATHETERS) ×1 IMPLANT
CLOTH BEACON ORANGE TIMEOUT ST (SAFETY) ×3 IMPLANT
COVER WAND RF STERILE (DRAPES) IMPLANT
EXTRACTOR STONE NITINOL NGAGE (UROLOGICAL SUPPLIES) ×3 IMPLANT
FIBER LASER FLEXIVA 1000 (UROLOGICAL SUPPLIES) IMPLANT
FIBER LASER FLEXIVA 365 (UROLOGICAL SUPPLIES) IMPLANT
FIBER LASER FLEXIVA 550 (UROLOGICAL SUPPLIES) IMPLANT
FIBER LASER TRAC TIP (UROLOGICAL SUPPLIES) IMPLANT
GLOVE SURG SS PI 8.0 STRL IVOR (GLOVE) ×2 IMPLANT
GOWN STRL REUS W/TWL XL LVL3 (GOWN DISPOSABLE) ×3 IMPLANT
GUIDEWIRE STR DUAL SENSOR (WIRE) ×3 IMPLANT
KIT TURNOVER KIT A (KITS) ×2 IMPLANT
MANIFOLD NEPTUNE II (INSTRUMENTS) ×3 IMPLANT
PACK CYSTO (CUSTOM PROCEDURE TRAY) ×3 IMPLANT
SHEATH URETERAL 12FRX35CM (MISCELLANEOUS) ×1 IMPLANT
SPONGE TONSIL 1.5 RFD TRIPLE (SPONGE) ×2 IMPLANT
TUBING CONNECTING 10 (TUBING) ×2 IMPLANT
TUBING CONNECTING 10' (TUBING) ×1
TUBING UROLOGY SET (TUBING) ×3 IMPLANT

## 2018-11-25 NOTE — Anesthesia Procedure Notes (Signed)
Procedure Name: LMA Insertion Date/Time: 11/25/2018 1:32 PM Performed by: [place] Joseluis Alessio C, CRNA Pre-anesthesia Checklist: Patient identified, Emergency Drugs available, Suction available and Patient being monitored Patient Re-evaluated:Patient Re-evaluated prior to induction Oxygen Delivery Method: Circle system utilized Preoxygenation: Pre-oxygenation with 100% oxygen Induction Type: IV induction Ventilation: Mask ventilation without difficulty LMA: LMA inserted LMA Size: 4.0 Number of attempts: 1 Airway Equipment and Method: Bite block Placement Confirmation: positive ETCO2 Tube secured with: Tape Dental Injury: Teeth and Oropharynx as per pre-operative assessment

## 2018-11-25 NOTE — Anesthesia Postprocedure Evaluation (Signed)
Anesthesia Post Note  Patient: Deardra Hinkley  Procedure(s) Performed: CYSTOSCOPY WITH RIGHT RETROGRADE URETEROSCOPY, STONE EXTRACTION (Right )     Patient location during evaluation: PACU Anesthesia Type: General Level of consciousness: awake and alert Pain management: pain level controlled Vital Signs Assessment: post-procedure vital signs reviewed and stable Respiratory status: spontaneous breathing, nonlabored ventilation, respiratory function stable and patient connected to nasal cannula oxygen Cardiovascular status: blood pressure returned to baseline and stable Postop Assessment: no apparent nausea or vomiting Anesthetic complications: no    Last Vitals:  Vitals:   11/25/18 1430 11/25/18 1438  BP: 123/82 129/77  Pulse: (!) 52 65  Resp: 10 15  Temp: 36.7 C 36.7 C  SpO2: 100% 98%    Last Pain:  Vitals:   11/25/18 1438  TempSrc:   PainSc: 0-No pain                 Jhovanny Guinta S

## 2018-11-25 NOTE — OR Nursing (Signed)
Stone taken by Dr. Wrenn 

## 2018-11-25 NOTE — Op Note (Signed)
Procedure: 1.  Cystoscopy with right retrograde pyelogram and interpretation. 2.  Right ureteroscopic stone extraction.  Preop diagnosis: Right distal ureteral stone.  Postoperative diagnosis: Same.  Surgeon: Dr. Irine Seal.  Anesthesia: General.  Specimen: Stone.  Drain: None.  EBL: None.  Complications: None.  Indications: Patient is a 53 year old female with a 2 to 3 mm right distal stone that had not progressed in 3 weeks and was associated with recurrent symptoms.  She elected ureteroscopy for treatment.  Procedure: She was taken operating room where she was given 2 g of Ancef.  A general anesthetic was induced.  She was placed in lithotomy position and fitted with PAS hose.  Her perineum and genitalia were prepped with Betadine solution and she was draped in usual sterile fashion.  Cystoscopy was performed using the 23 [place] scope and 30 degree lens.  Examination revealed a normal urethra.  The bladder wall had mild trabeculation and no mucosal lesions.  The ureteral orifice ease were unremarkable.  The right ureteral orifice was cannulated with a 5 French opening catheter and contrast was instilled.  Right retrograde pyelogram demonstrated a small filling defect in the ureter approximately 2 cm from the meatus consistent with a stone.  The 4.5 French single-lumen semirigid ureteroscope was then advanced up the ureter.  The stone was visualized and dislodged slightly proximally.  An engage basket was then used to retrieve the stone intact.  Reinspection of the ureter revealed no significant mucosal injury so it was felt the stent was not indicated.  The cystoscope sheath was reinserted and the bladder was drained.  The scope was removed.  She was taken down from lithotomy position, her anesthetic was reversed and she was moved recovery in stable condition.  There were no complications.

## 2018-11-25 NOTE — Discharge Instructions (Signed)
Kidney Stones  Kidney stones (urolithiasis) are solid, rock-like deposits that form inside of the organs that make urine (kidneys). A kidney stone may form in a kidney and move into the bladder, where it can cause intense pain and block the flow of urine. Kidney stones are created when high levels of certain minerals are found in the urine. They are usually passed through urination, but in some cases, medical treatment may be needed to remove them. What are the causes? Kidney stones may be caused by:  A condition in which certain glands produce too much parathyroid hormone (primary hyperparathyroidism), which causes too much calcium buildup in the blood.  Buildup of uric acid crystals in the bladder (hyperuricosuria). Uric acid is a chemical that the body produces when you eat certain foods. It usually exits the body in the urine.  Narrowing (stricture) of one or both of the tubes that drain urine from the kidneys to the bladder (ureters).  A kidney blockage that is present at birth (congenital obstruction).  Past surgery on the kidney or the ureters, such as gastric bypass surgery. What increases the risk? The following factors make you more likely to develop kidney stones:  Having had a kidney stone in the past.  Having a family history of kidney stones.  Not drinking enough water.  Eating a diet that is high in protein, salt (sodium), or sugar.  Being overweight or obese. What are the signs or symptoms? Symptoms of a kidney stone may include:  Nausea.  Vomiting.  Blood in the urine (hematuria).  Pain in the side of the abdomen, right below the ribs (flank pain). Pain usually spreads (radiates) to the groin.  Needing to urinate frequently or urgently. How is this diagnosed? This condition may be diagnosed based on:  Your medical history.  A physical exam.  Blood tests.  Urine tests.  CT scan.  Abdominal X-ray.  A procedure to examine the inside of the bladder  (cystoscopy). How is this treated? Treatment for kidney stones depends on the size, location, and makeup of the stones. Treatment may involve:  Analyzing your urine before and after you pass the stone through urination.  Being monitored at the hospital until you pass the stone through urination.  Increasing your fluid intake and decreasing the amount of calcium and protein in your diet.  A procedure to break up kidney stones in the bladder using: ? A focused beam of light (laser therapy). ? Shock waves (extracorporeal shock wave lithotripsy).  Surgery to remove kidney stones. This may be needed if you have severe pain or have stones that block your urinary tract. Follow these instructions at home: Eating and drinking  Drink enough fluid to keep your urine clear or pale yellow. This will help you to pass the kidney stone.  If directed, change your diet. This may include: ? Limiting how much sodium you eat. ? Eating more fruits and vegetables. ? Limiting how much meat, poultry, fish, and eggs you eat.  Follow instructions from your health care provider about eating or drinking restrictions. General instructions  Collect urine samples as told by your health care provider. You may need to collect a urine sample: ? 24 hours after you pass the stone. ? 8-12 weeks after passing the kidney stone, and every 6-12 months after that.  Strain your urine every time you urinate, for as long as directed. Use the strainer that your health care provider recommends.  Do not throw out the kidney stone after passing  it. Keep the stone so it can be tested by your health care provider. Testing the makeup of your kidney stone may help prevent you from getting kidney stones in the future.  Take over-the-counter and prescription medicines only as told by your health care provider.  Keep all follow-up visits as told by your health care provider. This is important. You may need follow-up X-rays or  ultrasounds to make sure that your stone has passed. How is this prevented? To prevent another kidney stone:  Drink enough fluid to keep your urine clear or pale yellow. This is the best way to prevent kidney stones.  Eat a healthy diet and follow recommendations from your health care provider about foods to avoid. You may be instructed to eat a low-protein diet. Recommendations vary depending on the type of kidney stone that you have.  Maintain a healthy weight. Contact a health care provider if:  You have pain that gets worse or does not get better with medicine. Get help right away if:  You have a fever or chills.  You develop severe pain.  You develop new abdominal pain.  You faint.  You are unable to urinate. This information is not intended to replace advice given to you by your health care provider. Make sure you discuss any questions you have with your health care provider. Document Released: 06/25/2005 Document Revised: 12/06/2016 Document Reviewed: 12/09/2015 Elsevier Interactive Patient Education  2019 [employer]  Ureteroscopy, Care After This sheet gives you information about how to care for yourself after your procedure. Your health care provider may also give you more specific instructions. If you have problems or questions, contact your health care provider. What can I expect after the procedure? After the procedure, it is common to have:  A burning sensation when you urinate.  Blood in your urine.  Mild discomfort in the bladder area or kidney area when urinating.  Needing to urinate more often or urgently. Follow these instructions at home:  Medicines  Take over-the-counter and prescription medicines only as told by your health care provider.  If you were prescribed an antibiotic medicine, take it as told by your health care provider. Do not stop taking the antibiotic even if you start to feel better. General instructions  Donot drive for 24 hours  if you were given a medicine to help you relax (sedative) during your procedure.  To relieve burning, try taking a warm bath or holding a warm washcloth over your groin.  Drink enough fluid to keep your urine clear or pale yellow. ? Drink two 8-ounce glasses of water every hour for the first 2 hours after you get home. ? Continue to drink water often at home.  You can eat what you usually do.  Keep all follow-up visits as told by your health care provider. This is important. ? If you had a tube placed to keep urine flowing (ureteral stent), ask your health care provider when you need to return to have it removed. Contact a health care provider if:  You have chills or a fever.  You have burning pain for longer than 24 hours after the procedure.  You have blood in your urine for longer than 24 hours after the procedure. Get help right away if:  You have large amounts of blood in your urine.  You have blood clots in your urine.  You have very bad pain.  You have chest pain or trouble breathing.  You are unable to urinate and you  have the feeling of a full bladder.  Please bring your stone to your follow up appointment for analysis.   This information is not intended to replace advice given to you by your health care provider. Make sure you discuss any questions you have with your health care provider. Document Released: 06/30/2013 Document Revised: 04/10/2016 Document Reviewed: 04/06/2016 Elsevier Interactive Patient Education  2019 [employer].

## 2018-11-25 NOTE — Anesthesia Preprocedure Evaluation (Signed)
Anesthesia Evaluation  Patient identified by MRN, date of birth, ID band Patient awake    Reviewed: Allergy & Precautions, NPO status , Patient's Chart, lab work & pertinent test results  Airway Mallampati: II  TM Distance: >3 FB Neck ROM: Full    Dental no notable dental hx.    Pulmonary neg pulmonary ROS,    Pulmonary exam normal breath sounds clear to auscultation       Cardiovascular negative cardio ROS Normal cardiovascular exam Rhythm:Regular Rate:Normal     Neuro/Psych negative neurological ROS  negative psych ROS   GI/Hepatic negative GI ROS, Neg liver ROS,   Endo/Other  negative endocrine ROS  Renal/GU negative Renal ROS  negative genitourinary   Musculoskeletal negative musculoskeletal ROS (+)   Abdominal   Peds negative pediatric ROS (+)  Hematology negative hematology ROS (+)   Anesthesia Other Findings   Reproductive/Obstetrics negative OB ROS                             Anesthesia Physical Anesthesia Plan  ASA: II  Anesthesia Plan: General   Post-op Pain Management:    Induction: Intravenous  PONV Risk Score and Plan: 3 and Ondansetron, Dexamethasone, Treatment may vary due to age or medical condition and Midazolam  Airway Management Planned: Oral ETT  Additional Equipment:   Intra-op Plan:   Post-operative Plan: Extubation in OR  Informed Consent: I have reviewed the patients History and Physical, chart, labs and discussed the procedure including the risks, benefits and alternatives for the proposed anesthesia with the patient or authorized representative who has indicated his/her understanding and acceptance.     Dental advisory given  Plan Discussed with: CRNA and Surgeon  Anesthesia Plan Comments:         Anesthesia Quick Evaluation

## 2018-11-25 NOTE — Interval H&P Note (Signed)
History and Physical Interval Note:  Her pain was better controlled with toradol but the KUB today still shows evidence of the stone.   11/25/2018 1:10 PM  Jennifer Cantu  has presented today for surgery, with the diagnosis of RIGHT URETERAL [place].  The various methods of treatment have been discussed with the patient and family. After consideration of risks, benefits and other options for treatment, the patient has consented to  Procedure(s): CYSTOSCOPY WITH RIGHT RETROGRADE URETEROSCOPY POSSIBLE HOLMIUM LASER POSSIBLE STENTAND STENT PLACEMENT (Right) as a surgical intervention.  The patient's history has been reviewed, patient examined, no change in status, stable for surgery.  I have reviewed the patient's chart and labs.  Questions were answered to the patient's satisfaction.     Irine Seal

## 2018-11-25 NOTE — Transfer of Care (Signed)
Immediate Anesthesia Transfer of Care Note  Patient: Jennifer Cantu  Procedure(s) Performed: CYSTOSCOPY WITH RIGHT RETROGRADE URETEROSCOPY, STONE EXTRACTION (Right )  Patient Location: PACU  Anesthesia Type:General  Level of Consciousness: awake, alert  and oriented  Airway & Oxygen Therapy: Patient Spontanous Breathing and Patient connected to face mask oxygen  Post-op Assessment: Report given to RN and Post -op Vital signs reviewed and stable  Post vital signs: Reviewed and stable  Last Vitals:  Vitals Value Taken Time  BP 120/76 11/25/2018  2:15 PM  Temp    Pulse 55 11/25/2018  2:20 PM  Resp 12 11/25/2018  2:20 PM  SpO2 99 % 11/25/2018  2:20 PM  Vitals shown include unvalidated device data.  Last Pain:  Vitals:   11/25/18 1402  TempSrc:   PainSc: 0-No pain         Complications: No apparent anesthesia complications

## 2018-11-26 ENCOUNTER — Encounter (HOSPITAL_COMMUNITY): Payer: Self-pay | Admitting: Urology

## 2019-01-07 ENCOUNTER — Other Ambulatory Visit: Payer: Self-pay | Admitting: Oncology

## 2019-01-07 DIAGNOSIS — R7989 Other specified abnormal findings of blood chemistry: Secondary | ICD-10-CM | POA: Insufficient documentation

## 2019-01-08 ENCOUNTER — Telehealth: Payer: Self-pay | Admitting: Oncology

## 2019-01-08 NOTE — Telephone Encounter (Signed)
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 Pt has been cld and scheduled to see Dr. Jana Hakim on 7/14 at 4pm w/labs on 7/8 at 3pm. Location provided to the pt.

## 2019-01-14 ENCOUNTER — Inpatient Hospital Stay: Payer: Commercial Managed Care - PPO | Attending: Oncology

## 2019-01-14 ENCOUNTER — Other Ambulatory Visit: Payer: Self-pay

## 2019-01-14 DIAGNOSIS — R768 Other specified abnormal immunological findings in serum: Secondary | ICD-10-CM | POA: Diagnosis not present

## 2019-01-14 DIAGNOSIS — Z79899 Other long term (current) drug therapy: Secondary | ICD-10-CM | POA: Diagnosis not present

## 2019-01-14 DIAGNOSIS — Z793 Long term (current) use of hormonal contraceptives: Secondary | ICD-10-CM | POA: Diagnosis not present

## 2019-01-14 DIAGNOSIS — R7989 Other specified abnormal findings of blood chemistry: Secondary | ICD-10-CM | POA: Diagnosis present

## 2019-01-14 DIAGNOSIS — Z791 Long term (current) use of non-steroidal anti-inflammatories (NSAID): Secondary | ICD-10-CM | POA: Insufficient documentation

## 2019-01-14 DIAGNOSIS — R6889 Other general symptoms and signs: Secondary | ICD-10-CM | POA: Insufficient documentation

## 2019-01-14 LAB — CBC WITH DIFFERENTIAL/PLATELET
Abs Immature Granulocytes: 0.01 10*3/uL (ref 0.00–0.07)
Basophils Absolute: 0 10*3/uL (ref 0.0–0.1)
Basophils Relative: 1 %
Eosinophils Absolute: 0 10*3/uL (ref 0.0–0.5)
Eosinophils Relative: 0 %
HCT: 40.2 % (ref 36.0–46.0)
Hemoglobin: 12.9 g/dL (ref 12.0–15.0)
Immature Granulocytes: 0 %
Lymphocytes Relative: 24 %
Lymphs Abs: 1.5 10*3/uL (ref 0.7–4.0)
MCH: 30 pg (ref 26.0–34.0)
MCHC: 32.1 g/dL (ref 30.0–36.0)
MCV: 93.5 fL (ref 80.0–100.0)
Monocytes Absolute: 0.4 10*3/uL (ref 0.1–1.0)
Monocytes Relative: 7 %
Neutro Abs: 4.1 10*3/uL (ref 1.7–7.7)
Neutrophils Relative %: 68 %
Platelets: 199 10*3/uL (ref 150–400)
RBC: 4.3 MIL/uL (ref 3.87–5.11)
RDW: 12.5 % (ref 11.5–15.5)
WBC: 6.1 10*3/uL (ref 4.0–10.5)
nRBC: 0 % (ref 0.0–0.2)

## 2019-01-14 LAB — C-REACTIVE PROTEIN: CRP: <0.8 mg/dL (ref <1.0)

## 2019-01-14 LAB — SEDIMENTATION RATE: Sed Rate: 2 mm/hr (ref 0–22)

## 2019-01-14 LAB — SAVE SMEAR(SSMR), FOR PROVIDER SLIDE REVIEW

## 2019-01-15 LAB — IRON AND TIBC
Iron: 66 ug/dL (ref 41–142)
Saturation Ratios: 18 % — ABNORMAL LOW (ref 21–57)
TIBC: 374 ug/dL (ref 236–444)
UIBC: 308 ug/dL (ref 120–384)

## 2019-01-15 LAB — FERRITIN: Ferritin: 159 ng/mL (ref 11–307)

## 2019-01-15 LAB — FANA STAINING PATTERNS: Homogeneous Pattern: 1:320 {titer} — ABNORMAL HIGH

## 2019-01-15 LAB — ANTINUCLEAR ANTIBODIES, IFA: ANA Ab, IFA: POSITIVE — AB

## 2019-01-19 ENCOUNTER — Encounter: Payer: Self-pay | Admitting: Oncology

## 2019-01-19 NOTE — Progress Notes (Signed)
[place]  [phone] [phone]     ID: Jennifer Cantu DOB: 09-06-1965  MR#: [id number]  JYN#:[id number]  Patient Care Team: Rory Percy, MD as PCP - General (Family Medicine) Laurenashley Viar, Virgie Dad, MD as Consulting Physician (Oncology) Maisie Fus, MD as Consulting Physician (Obstetrics and Gynecology) Irine Seal, MD as Consulting Physician (Urology) Madelin Headings, DO as Consulting Physician (Optometry) Chauncey Cruel, MD OTHER MD:  CHIEF COMPLAINT: possible iron overload  CURRENT TREATMENT: Observation   HISTORY OF CURRENT ILLNESS: Jennifer Cantu presented to the emergency room 11/02/2018 with right flank pain.  A CT renal stone study on the same day showed a 3 mm distal right ureteral stone with mild right hydronephrosis.  She was treated symptomatically but referred to Dr. Jeffie Pollock when the symptoms worsened.  On 11/25/2018 she underwent cystoscopy and a right retrograde pyelogram, with this Dron retrieved with no complications.  Subsequently the patient saw her gynecologist, Dr. Milta Deiters, and he obtained multiple routine studies including a normal CBC, normal c-Met, lipid panel showing a cholesterol of 187 HDL 60 and LDL of 100, hemoglobin A1c of 5.2 and vitamin D 25 hydroxy 62.3.  Vitamin B12 was 294.  The ferritin was elevated at 244 (normal in that lab 15-150).  The serum iron was 92 (WNL).  A concern was raised regarding possible iron overload and the patient was referred for further evaluation.   INTERVAL HISTORY: Jennifer Cantu Was evaluated in the hematology clinic on 01/20/2019. Her husband Aaron Edelman participated by face time.   REVIEW OF SYSTEMS: Jennifer Cantu has a history of chronic dry eye problems.  She complains of some dry mouth at night and keeps a bottle of water at her bedside. She notes some swallowing issues a few weeks ago, but this is unusual.  She tells me she has had a rash on her neck and sometimes the rash in her legs in the past.  No  particular cause of this was discovered; she thinks it may have been related to soap.  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 She walks and jogs for exercise. The patient denies unusual headaches, visual changes, nausea, vomiting, stiff neck, dizziness, or gait imbalance. There has been no cough, phlegm production, or pleurisy, no chest pain or pressure.  She does have a history of hematuria she says.  She sometimes has pain in the palm of her right hand and when she does she has a hard time making a fist.  This is intermittent.  The patient denies fever, bleeding, unexplained fatigue or unexplained weight loss. A detailed review of systems was otherwise negative.   PAST MEDICAL HISTORY: Past Medical History:  Diagnosis Date   Corneal erosion of both eyes    DJD (degenerative joint disease)    L 4 TO L5   High serum ferritin    History of kidney stones     PAST SURGICAL HISTORY: Past Surgical History:  Procedure Laterality Date   ABDOMINAL SURGERY     CHOLECYSTECTOMY     LAP   CYSTOSCOPY WITH RETROGRADE PYELOGRAM, URETEROSCOPY AND STENT PLACEMENT Right 11/25/2018   Procedure: CYSTOSCOPY WITH RIGHT RETROGRADE URETEROSCOPY, STONE EXTRACTION;  Surgeon: Irine Seal, MD;  Location: [facility];  Service: Urology;  Laterality: Right;   WISDOM TOOTH EXTRACTION Bilateral    "when I was young"    FAMILY HISTORY: No family history on file. Patient's father is living at 100 years old. Patient's mother is also living at age 33. The  patient denies a family hx of breast or ovarian cancer. She has a living sister. She notes a brother that died at birth.   GYNECOLOGIC HISTORY:  No LMP recorded. (Menstrual status: IUD). Menarche: 53 years old Age at first live birth: 53 years old Dinosaur P 1 LMP: she had regular periods, but they were very heavy. Contraceptive: She uses a Mirena IUD (last 8 years) and was on birth control pills before this.  HRT n/a  Hysterectomy? no BSO?  no   SOCIAL HISTORY: (updated 01/20/2019)  Ever is currently working for [employer] She is married. Husband Aaron Edelman works for [employer].  She lives at home with her husband and son, who is home for the summer. Son Roxy Manns attends [employer] and wants to be a physical therapist.  The patient attends [employer].    ADVANCED DIRECTIVES: Husband Aaron Edelman is her HCPOA.   HEALTH MAINTENANCE: Social History   Tobacco Use   Smoking status: Never Smoker   Smokeless tobacco: Never Used  Substance Use Topics   Alcohol use: Not Currently    Comment: RARE   Drug use: Never     Colonoscopy: planned for 2020 (delayed with pandemic)  PAP: 01/05/2019  Bone density: osteopenia   No Known Allergies  Current Outpatient Medications  Medication Sig Dispense Refill   acetaminophen (TYLENOL) 500 MG tablet Take 500 mg by mouth every 6 (six) hours as needed. TAKES 1 TO 2     ALPRAZolam (XANAX) 0.5 MG tablet Take 0.5 mg by mouth at bedtime.      levonorgestrel (MIRENA) 20 MCG/24HR IUD 1 each by Intrauterine route once. INSERTED 2018     Multiple Vitamins-Minerals (ADULT GUMMY PO) Take 2 tablets by mouth 3 (three) times a week.     PREMARIN 0.9 MG tablet Take 0.9 mg by mouth daily.      XIIDRA 5 % SOLN Place 1 drop into both eyes 2 (two) times a day.     clobetasol ointment (TEMOVATE) 1.88 % Apply 1 application topically 2 (two) times daily as needed (skin irritation.).      FIBER SELECT GUMMIES PO Take 2 tablets by mouth 3 (three) times a week.     ibuprofen (ADVIL) 200 MG tablet Take 600-800 mg by mouth every 8 (eight) hours as needed (for pain.).     No current facility-administered medications for this visit.     OBJECTIVE: Middle-aged white woman who appears younger than stated age  4:   01/20/19 1600  BP: 124/63  Pulse: 63  Resp: 18  SpO2: 100%     Body mass index is 21.59 kg/m.   Wt Readings from Last 3 Encounters:  01/20/19 125 lb 12.8  oz (57.1 kg)  11/24/18 124 lb 8 oz (56.5 kg)  11/02/18 124 lb (56.2 kg)      ECOG FS:1 - Symptomatic but completely ambulatory  Ocular: Sclerae unicteric, pupils round and equal Wearing a mask Lymphatic: No cervical or supraclavicular adenopathy Lungs no rales or rhonchi Heart regular rate and rhythm Abd soft, nontender, positive bowel sounds, no palpable splenomegaly MSK no focal spinal tenderness, no joint edema Neuro: non-focal, well-oriented, appropriate affect Breasts: Deferred Skin: No obvious rash   LAB RESULTS:  CMP     Component Value Date/Time   NA 138 11/24/2018 1310   K 4.5 11/24/2018 1310   CL 103 11/24/2018 1310   CO2 28 11/24/2018 1310   GLUCOSE 97 11/24/2018 1310   BUN 13 11/24/2018 1310  CREATININE 1.27 (H) 11/24/2018 1310   CALCIUM 8.9 11/24/2018 1310   PROT 7.1 11/02/2018 0221   ALBUMIN 4.5 11/02/2018 0221   AST 23 11/02/2018 0221   ALT 16 11/02/2018 0221   ALKPHOS 40 11/02/2018 0221   BILITOT 0.4 11/02/2018 0221   GFRNONAA 49 (L) 11/24/2018 1310   GFRAA 57 (L) 11/24/2018 1310    No results found for: TOTALPROTELP, ALBUMINELP, A1GS, A2GS, BETS, BETA2SER, GAMS, MSPIKE, SPEI  No results found for: KPAFRELGTCHN, LAMBDASER, [facility]  Lab Results  Component Value Date   WBC 6.1 01/14/2019   NEUTROABS 4.1 01/14/2019   HGB 12.9 01/14/2019   HCT 40.2 01/14/2019   MCV 93.5 01/14/2019   PLT 199 01/14/2019    '@LASTCHEMISTRY'$ @  No results found for: LABCA2  No components found for: VHQION629  No results for input(s): INR in the last 168 hours.  No results found for: LABCA2  No results found for: BMW413  No results found for: KGM010  No results found for: UVO536  No results found for: CA2729  No components found for: HGQUANT  No results found for: CEA1 / No results found for: CEA1   No results found for: AFPTUMOR  No results found for: CHROMOGRNA  No results found for: PSA1  No visits with results within 3 Day(s) from this  visit.  Latest known visit with results is:  Clinical Support on 01/14/2019  Component Date Value Ref Range Status   ANA Ab, IFA 01/14/2019 Positive*  Final   Comment: (NOTE)                                     Negative   <1:80                                     Borderline  1:80                                     Positive   >1:80 Performed At: [facility] [place], [place] [id number] Rush Farmer MD [id number]    Smear Review 01/14/2019 SMEAR STAINED AND AVAILABLE FOR REVIEW   Final   Performed at [facility], [address] [place], [place] [address]   WBC 01/14/2019 6.1  4.0 - 10.5 K/uL Final   RBC 01/14/2019 4.30  3.87 - 5.11 MIL/uL Final   Hemoglobin 01/14/2019 12.9  12.0 - 15.0 g/dL Final   HCT 01/14/2019 40.2  36.0 - 46.0 % Final   MCV 01/14/2019 93.5  80.0 - 100.0 fL Final   MCH 01/14/2019 30.0  26.0 - 34.0 pg Final   MCHC 01/14/2019 32.1  30.0 - 36.0 g/dL Final   RDW 01/14/2019 12.5  11.5 - 15.5 % Final   Platelets 01/14/2019 199  150 - 400 K/uL Final   nRBC 01/14/2019 0.0  0.0 - 0.2 % Final   Neutrophils Relative % 01/14/2019 68  % Final   Neutro Abs 01/14/2019 4.1  1.7 - 7.7 K/uL Final   Lymphocytes Relative 01/14/2019 24  % Final   Lymphs Abs 01/14/2019 1.5  0.7 - 4.0 K/uL Final   Monocytes Relative 01/14/2019 7  % Final   Monocytes Absolute 01/14/2019 0.4  0.1 - 1.0 K/uL Final  Eosinophils Relative 01/14/2019 0  % Final   Eosinophils Absolute 01/14/2019 0.0  0.0 - 0.5 K/uL Final   Basophils Relative 01/14/2019 1  % Final   Basophils Absolute 01/14/2019 0.0  0.0 - 0.1 K/uL Final   Immature Granulocytes 01/14/2019 0  % Final   Abs Immature Granulocytes 01/14/2019 0.01  0.00 - 0.07 K/uL Final   Performed at [facility], [place] [address], [place], [place] [address]   CRP 01/14/2019 <0.8  <1.0 mg/dL Final   Performed at [place]  [address] [place], [place] [address]   Sed Rate 01/14/2019 2  0 - 22 mm/hr Final   Performed at [facility], [place] [address] [place], [place] [address]   Iron 01/14/2019 66  41 - 142 ug/dL Final   TIBC 01/14/2019 374  236 - 444 ug/dL Final   Saturation Ratios 01/14/2019 18* 21 - 57 % Final   UIBC 01/14/2019 308  120 - 384 ug/dL Final   Performed at [facility], [place] [address] [place], [place] [address]   Ferritin 01/14/2019 159  11 - 307 ng/mL Final   Performed at [facility], [place] [address] [place], [place] [address]   Homogeneous Pattern 01/14/2019 1:320*  Final   Note: 01/14/2019 Comment   Final   Comment: (NOTE) A positive ANA result may occur in healthy individuals (low titer) or be associated with a variety of diseases.  See interpretation chart which is not all inclusive: Pattern      Antigen Detected  Suggested Disease Association -----------  ----------------  ----------------------------- Homogeneous  DNA(ds,ss),       SLE - High titers             Nucleosomes,             Histones          Drug-induced SLE -----------  ----------------  ----------------------------- Speckled     Sm, RNP, SCL-70,  SLE,MCTD,PSS (diffuse form),             SS-A/SS-B         Sjogrens -----------  ----------------  ----------------------------- Nucleolar    SCL-70, PM-1/SCL  High titers Scleroderma,                               PM/DM -----------  ----------------  ----------------------------- Centromere   Centromere        PSS (limited form) w/Crest                               syndrome variable -----------  ----------------  ----------------------------- Nuclear Dot  Sp100,p80-c                          oilin  Primary Biliary Cirrhosis -----------  ----------------  ----------------------------- Nuclear      GP210,            Primary Biliary Cirrhosis Membrane     lamin A,B,C -----------  ----------------   ----------------------------- Performed At: [facility] [place], [place] [id number] Rush Farmer MD [phone]     (this displays the last labs from the last 3 days)  No results found for: TOTALPROTELP, ALBUMINELP, A1GS, A2GS, BETS, BETA2SER, GAMS, MSPIKE, SPEI (this displays SPEP labs)  No results found for: KPAFRELGTCHN,  LAMBDASER, KAPLAMBRATIO (kappa/lambda light chains)  No results found for: HGBA, HGBA2QUANT, HGBFQUANT, HGBSQUAN (Hemoglobinopathy evaluation)   No results found for: LDH  Lab Results  Component Value Date   IRON 66 01/14/2019   TIBC 374 01/14/2019   IRONPCTSAT 18 (L) 01/14/2019   (Iron and TIBC)  Lab Results  Component Value Date   FERRITIN 159 01/14/2019    Urinalysis    Component Value Date/Time   COLORURINE STRAW (A) 11/02/2018 0221   APPEARANCEUR CLEAR 11/02/2018 0221   LABSPEC 1.008 11/02/2018 0221   PHURINE 6.0 11/02/2018 0221   GLUCOSEU NEGATIVE 11/02/2018 0221   HGBUR NEGATIVE 11/02/2018 0221   BILIRUBINUR NEGATIVE 11/02/2018 0221   KETONESUR 5 (A) 11/02/2018 0221   PROTEINUR NEGATIVE 11/02/2018 0221   NITRITE NEGATIVE 11/02/2018 0221   LEUKOCYTESUR NEGATIVE 11/02/2018 0221     STUDIES: No results found.  ELIGIBLE FOR AVAILABLE RESEARCH PROTOCOL: no  ASSESSMENT: 53 y.o. [place] woman referred for possible hemochromatosis or other cause of iron overload, with serum ferritin on 11/28/2018 being 244 and serum iron 92.  Repeat lab work 01/14/2019 here shows a serum iron to be 66, with a saturation ratio of 18%, which is actually low.  The ferritin is 155 which is normal in our lab (11-307).  Other labs obtained the same day shows a normal C-reactive protein and sedimentation rate, but a positive ANA with a titer of 1: 320 in a homogeneous pattern.  PLAN: I spent approximately 50 minutes face to face with Dominick with more than 50% of that time spent in counseling and coordination of care.  We discussed iron  metabolism and the fact that iron overload is unusual in women since they have decades of iron loss through menstruation.  Of course she has not had a period in many years because of her IUD.  We discussed the fact that there is no organ for iron excretion, that iron is usually lost from the body by bleeding, and that some people for example with hemochromatosis, have inappropriately high iron absorption leading to iron overload problems.  Right now however her ferritin is normal.  This indicates her current iron stores are not elevated.  Furthermore her iron saturation is low.  This indicates the body is having a bit of trouble, although not much, mobilizing her iron.  To understand this better we discussed the fact that the ferritin is an acute phase reactant.  This means that its level rises with inflammation or infection.  While generally the ferritin level reflects iron storage levels in the body, under circumstances where there is inflammation the level may not be an accurate reflection of iron stores in the body.  Ilze's ferritin was elevated when tested in May, possibly because of her recent problems with kidney stones and the interventions she underwent to retrieve the stone.  The ferritin level has subsequently subsided to the normal range. Currently her C-reactive protein and sed rate are not indicative of significant inflammation.  Accordingly I would conclude from the current labs that the patient's iron stores are in the normal range, not elevated and certainly not low.  The fact that the iron saturation is low indicates that mobilization of iron storage is impaired.  This is caused by inappropriate hepcidin production which can happen for any number of reasons and it is ultimately the cause of what we call "anemia of chronic disease".  The patient is generally well but does have a few symptoms which may be relevant: chronically dry eyes,  a history of unexplained rashes, perhaps slight  difficulty swallowing.  In this regard the patient's ANA, with a titer of 1: 320, may be significant.   She understands many normal people do have positive ANAs which may be transient and may not be important.  On the other hand she could have a low level rheumatologic/autoimmune condition which might explain her dry eyes and possibly the rashes she has experienced before.  I offered her referral to rheumatology but at this point she demurs.  In short, from a hematology point of view, I do not find evidence of an iron overload syndrome and I do not believe further work-up is necessary. I am not making Cassandr a return appointment here but of course I will be glad to see her again at any point in the future if and when that becomes necessary.   Chauncey Cruel, MD   01/20/2019 5:15 PM Medical Oncology and Hematology [facility] [address] [place], [place] [address] Tel. [phone]    Fax. [phone]   This document serves as a record of services personally performed by Lurline Del, MD. It was created on his behalf by Wilburn Mylar, a trained medical scribe. The creation of this record is based on the scribe's personal observations and the provider's statements to them.   I, Lurline Del MD, have reviewed the above documentation for accuracy and completeness, and I agree with the above.

## 2019-01-20 ENCOUNTER — Other Ambulatory Visit: Payer: Self-pay

## 2019-01-20 ENCOUNTER — Inpatient Hospital Stay (HOSPITAL_BASED_OUTPATIENT_CLINIC_OR_DEPARTMENT_OTHER): Payer: Commercial Managed Care - PPO | Admitting: Oncology

## 2019-01-20 ENCOUNTER — Encounter: Payer: Self-pay | Admitting: Oncology

## 2019-01-20 VITALS — BP 124/63 | HR 63 | Resp 18 | Ht 64.0 in | Wt 125.8 lb

## 2019-01-20 DIAGNOSIS — R7989 Other specified abnormal findings of blood chemistry: Secondary | ICD-10-CM | POA: Diagnosis not present

## 2019-01-20 DIAGNOSIS — R768 Other specified abnormal immunological findings in serum: Secondary | ICD-10-CM

## 2019-01-20 DIAGNOSIS — Z791 Long term (current) use of non-steroidal anti-inflammatories (NSAID): Secondary | ICD-10-CM

## 2019-01-20 DIAGNOSIS — Z79899 Other long term (current) drug therapy: Secondary | ICD-10-CM

## 2019-01-20 DIAGNOSIS — R6889 Other general symptoms and signs: Secondary | ICD-10-CM | POA: Diagnosis not present

## 2019-01-20 DIAGNOSIS — Z793 Long term (current) use of hormonal contraceptives: Secondary | ICD-10-CM

## 2019-06-25 IMAGING — CT CT RENAL STONE PROTOCOL
2 of 4 series · 16 of 46 positions shown, 18 images · non-contrast
Comparison: None.

CLINICAL DATA: Right side flank pain.

EXAM:
CT ABDOMEN AND PELVIS WITHOUT CONTRAST
TECHNIQUE: Multidetector CT imaging of the abdomen and pelvis was performed
following the standard protocol without IV contrast.

[Series 2: axial st · axial · 0.79mm/px · z∈[+764,+1159]mm · 13 of 89 slices shown, 15 images]
[im 5/89  soft-tissue]
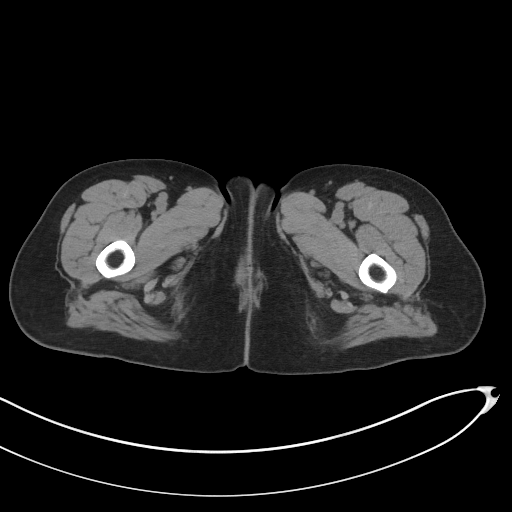
[im 5/89  bone]
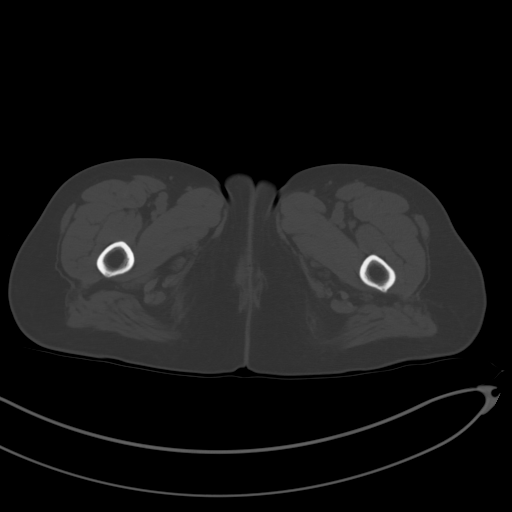
[im 14/89  soft-tissue]
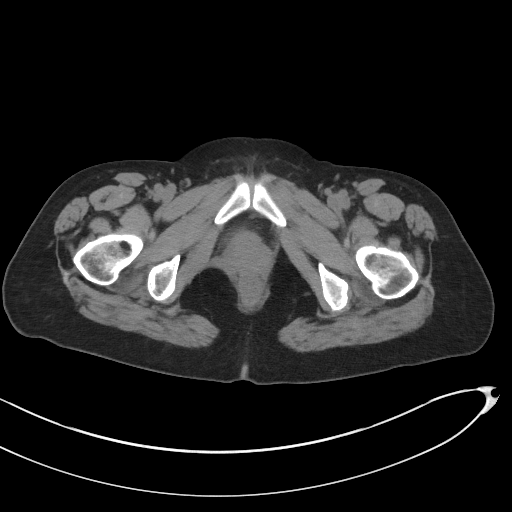
[im 19/89  soft-tissue]
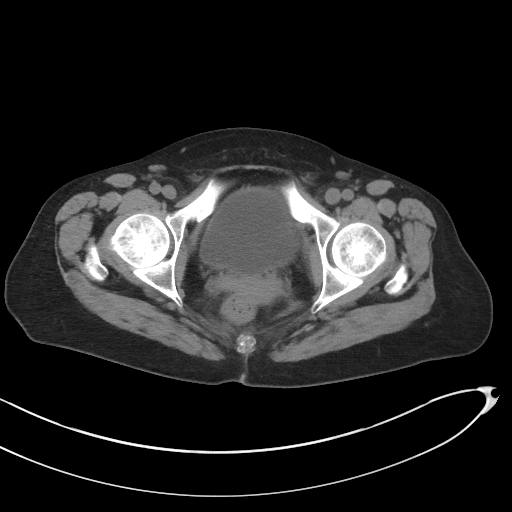
[im 24/89  soft-tissue]
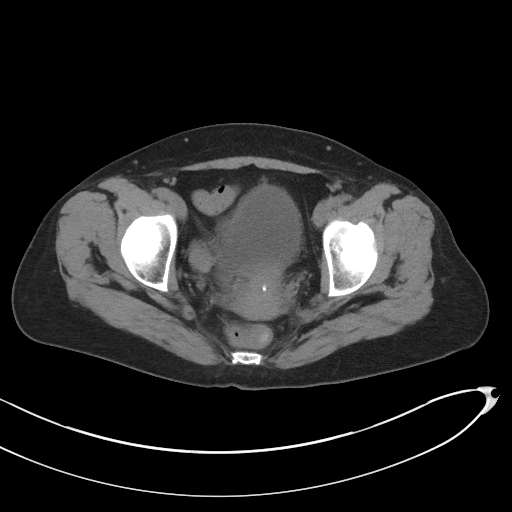
[im 33/89  soft-tissue]
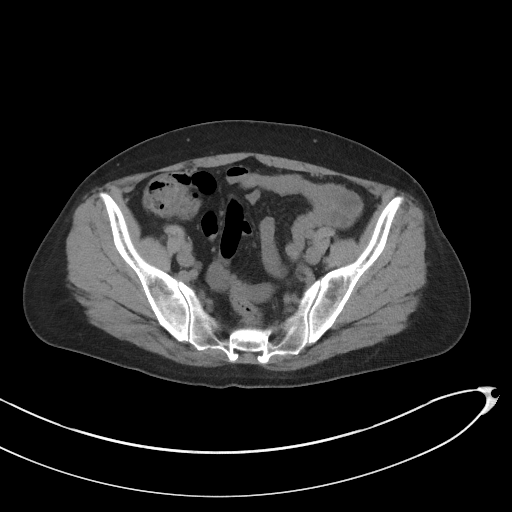
[im 38/89  soft-tissue]
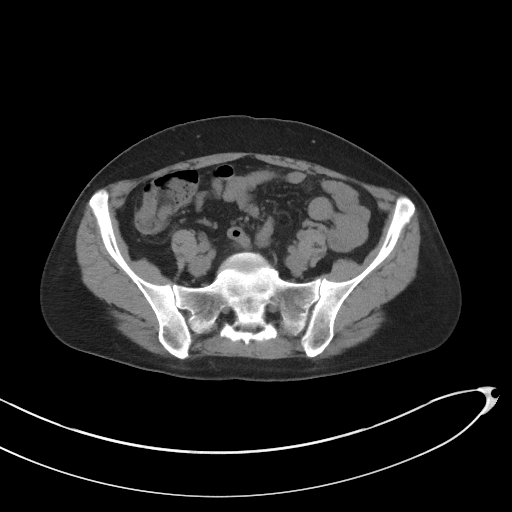
[im 47/89  soft-tissue]
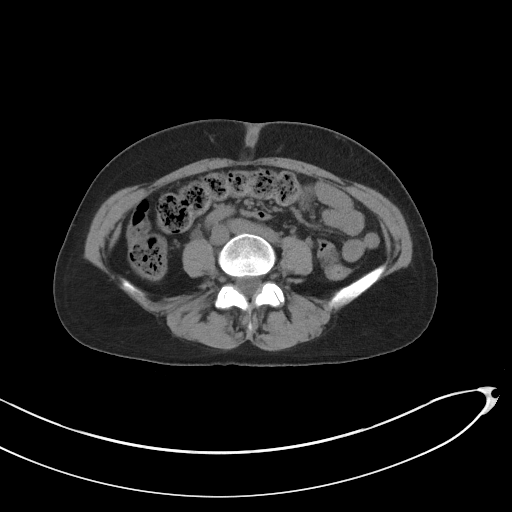
[im 51/89  soft-tissue]
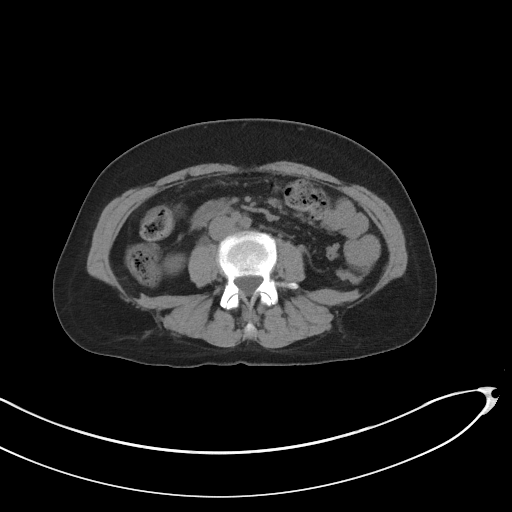
[im 56/89  soft-tissue]
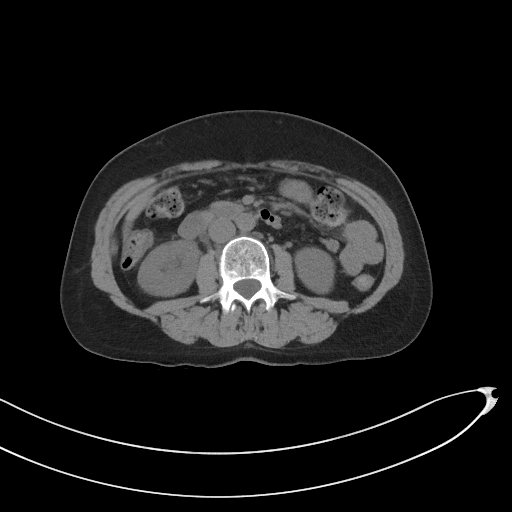
[im 56/89  bone]
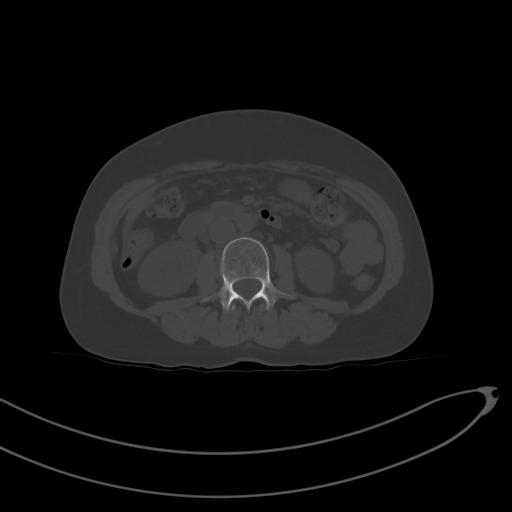
[im 65/89  soft-tissue]
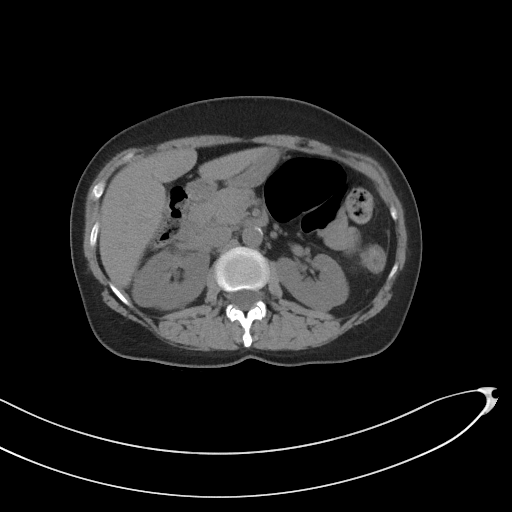
[im 70/89  soft-tissue]
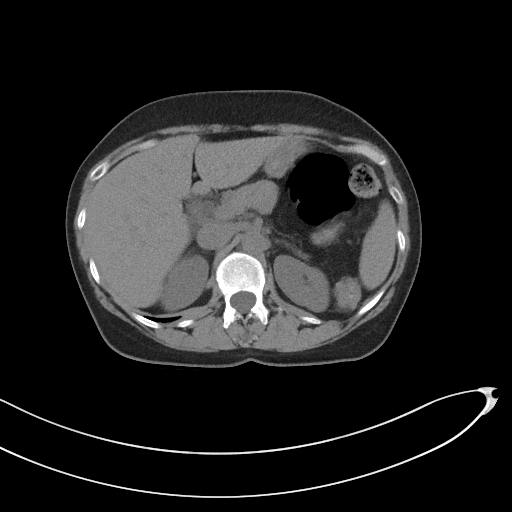
[im 75/89  soft-tissue]
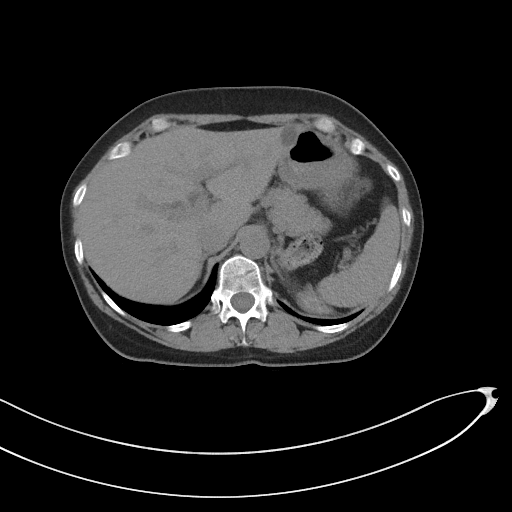
[im 84/89  soft-tissue]
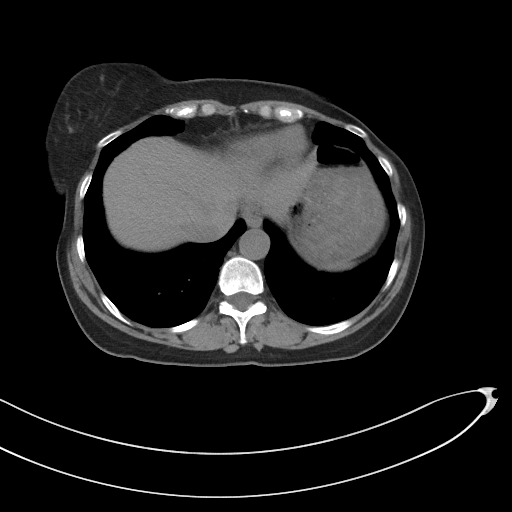

[Series 5: coronal st · coronal · 0.68mm/px · 3 of 88 slices shown]
[im 39/88  soft-tissue]
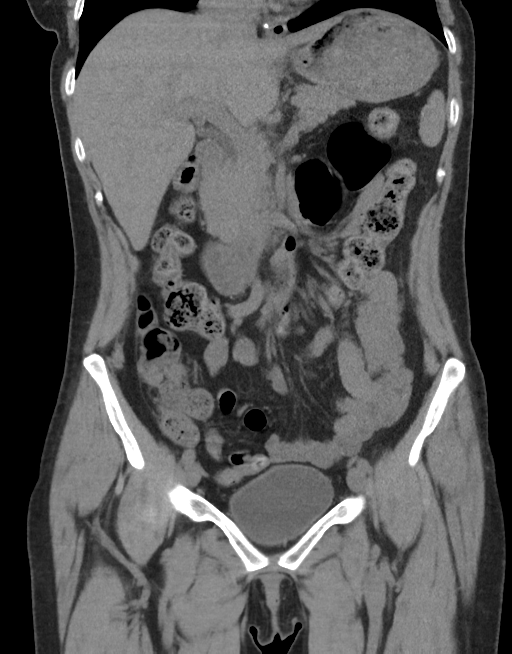
[im 49/88  soft-tissue]
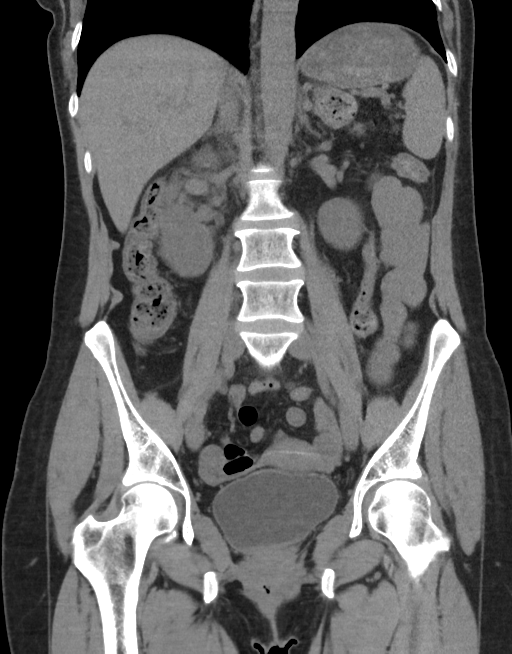
[im 59/88  soft-tissue]
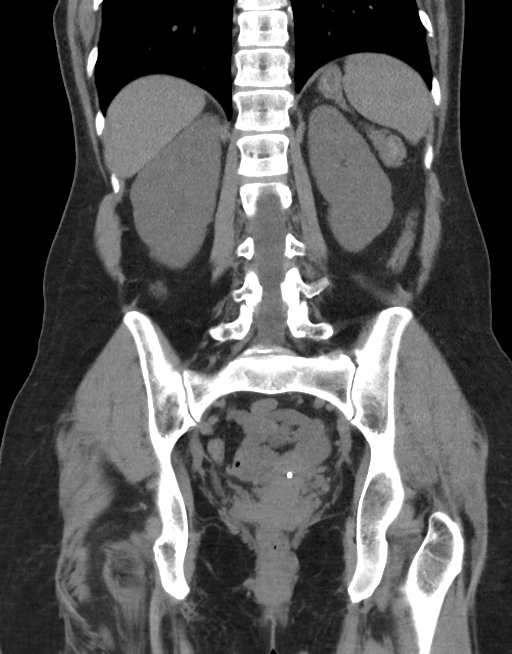

[16 of 46 positions shown; findings below may reference images not displayed]

FINDINGS: Lower chest: Lung bases are clear. No effusions. Heart is normal
size.

Hepatobiliary: 1.5 cm cyst in the left hepatic lobe. Prior
cholecystectomy.

Pancreas: No focal abnormality or ductal dilatation.

Spleen: No focal abnormality.  Normal size.

Adrenals/Urinary Tract: Mild right hydronephrosis and hydroureter
due to 2-3 mm distal right ureteral stone. No stones or
hydronephrosis on the left. Adrenal glands and urinary bladder
unremarkable.

Stomach/Bowel: Normal appendix. Stomach, large and small bowel
grossly unremarkable.

Vascular/Lymphatic: No evidence of aneurysm or adenopathy.

Reproductive: IUD noted in the uterus. Uterus and adnexa
unremarkable. No mass.

Other: No free fluid or free air.

Musculoskeletal: No acute bony abnormality.
IMPRESSION: 2-3 mm distal right ureteral stone with mild right hydronephrosis.

## 2019-07-18 IMAGING — CR ABDOMEN - 1 VIEW
2 series · 2 of 2 positions shown · non-contrast
Comparison: CT abdomen pelvis, 11/02/2018

CLINICAL DATA: Preoperative, right ureteral stone

EXAM:
ABDOMEN - 1 VIEW

[t abdomen supine (1 of 2)]
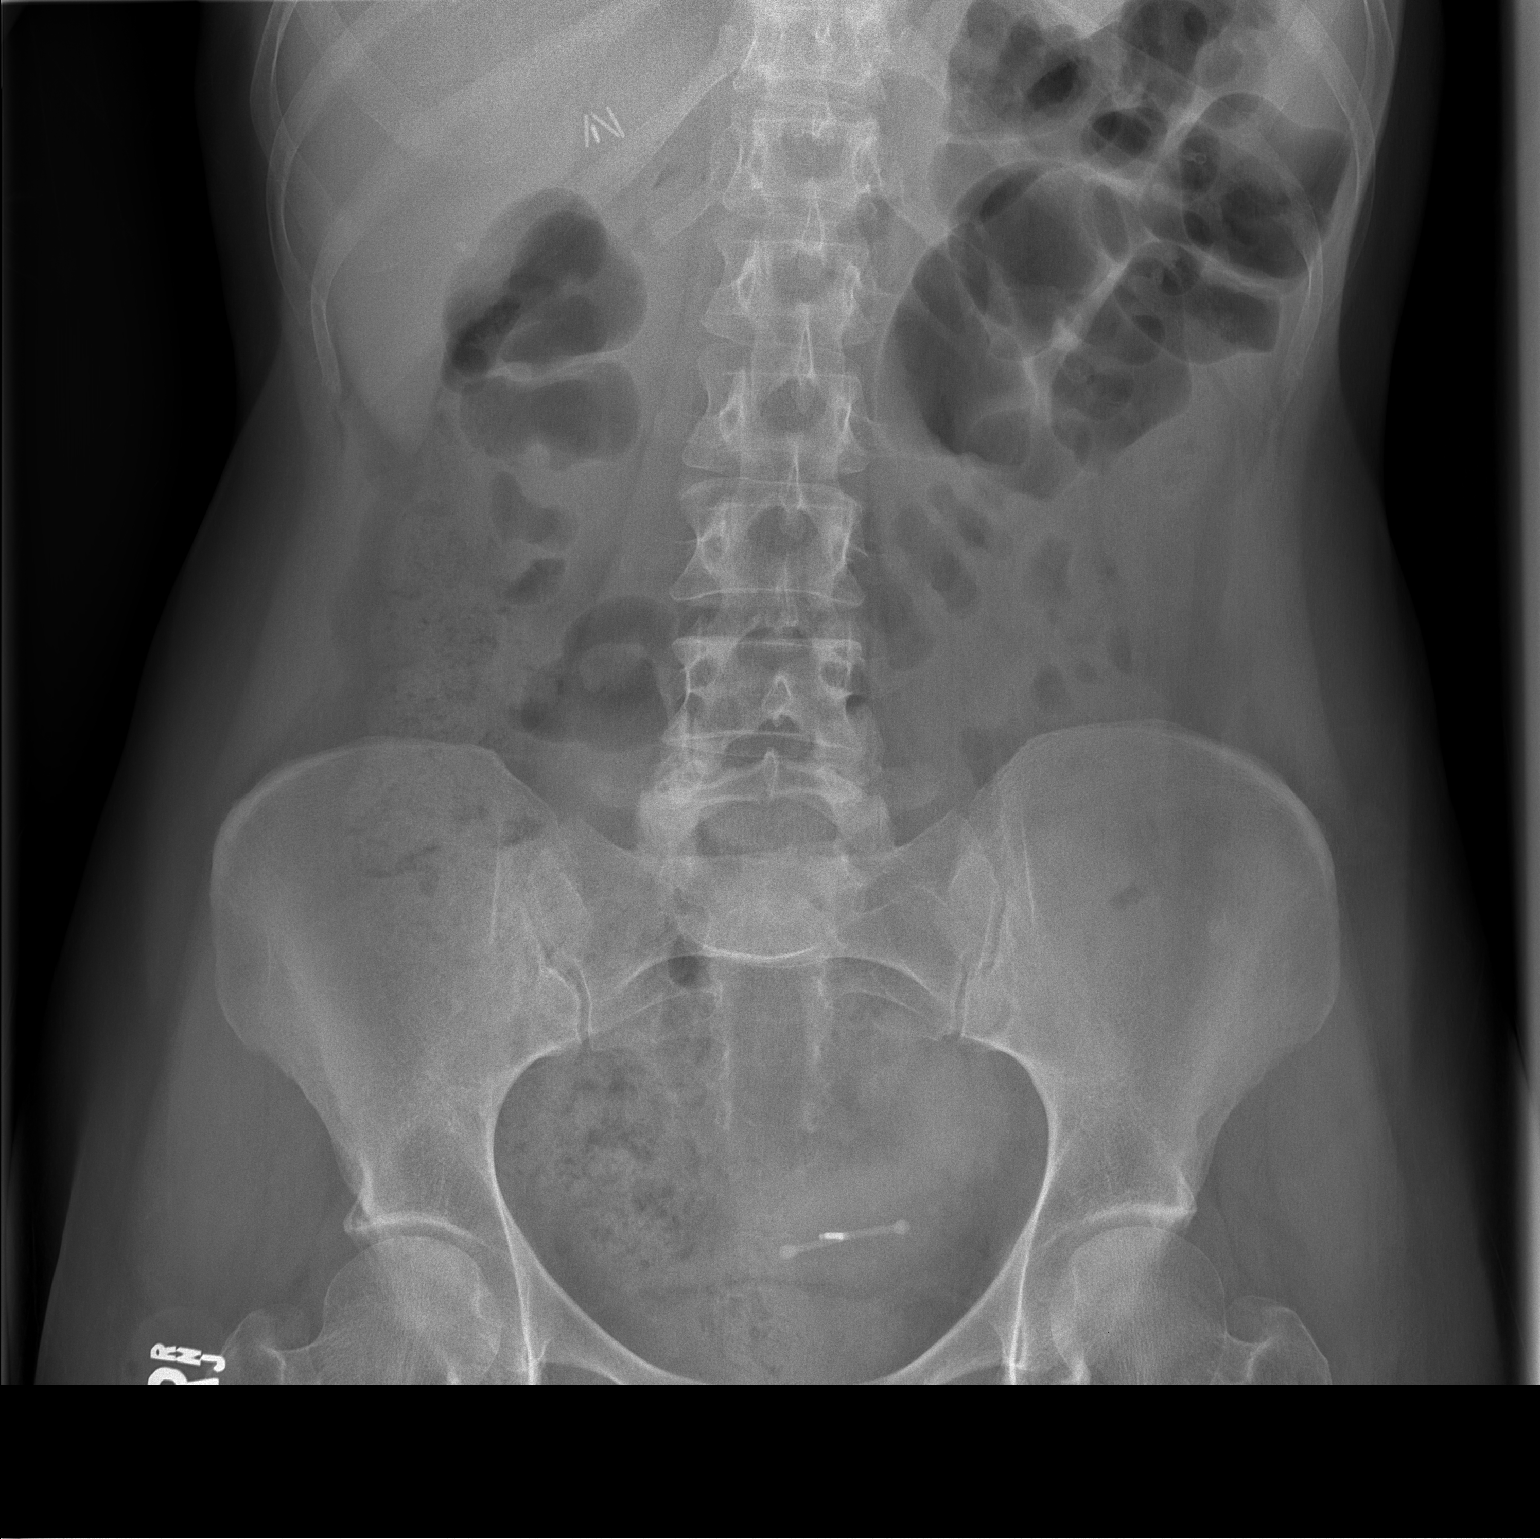

[t abdomen supine (2 of 2)]
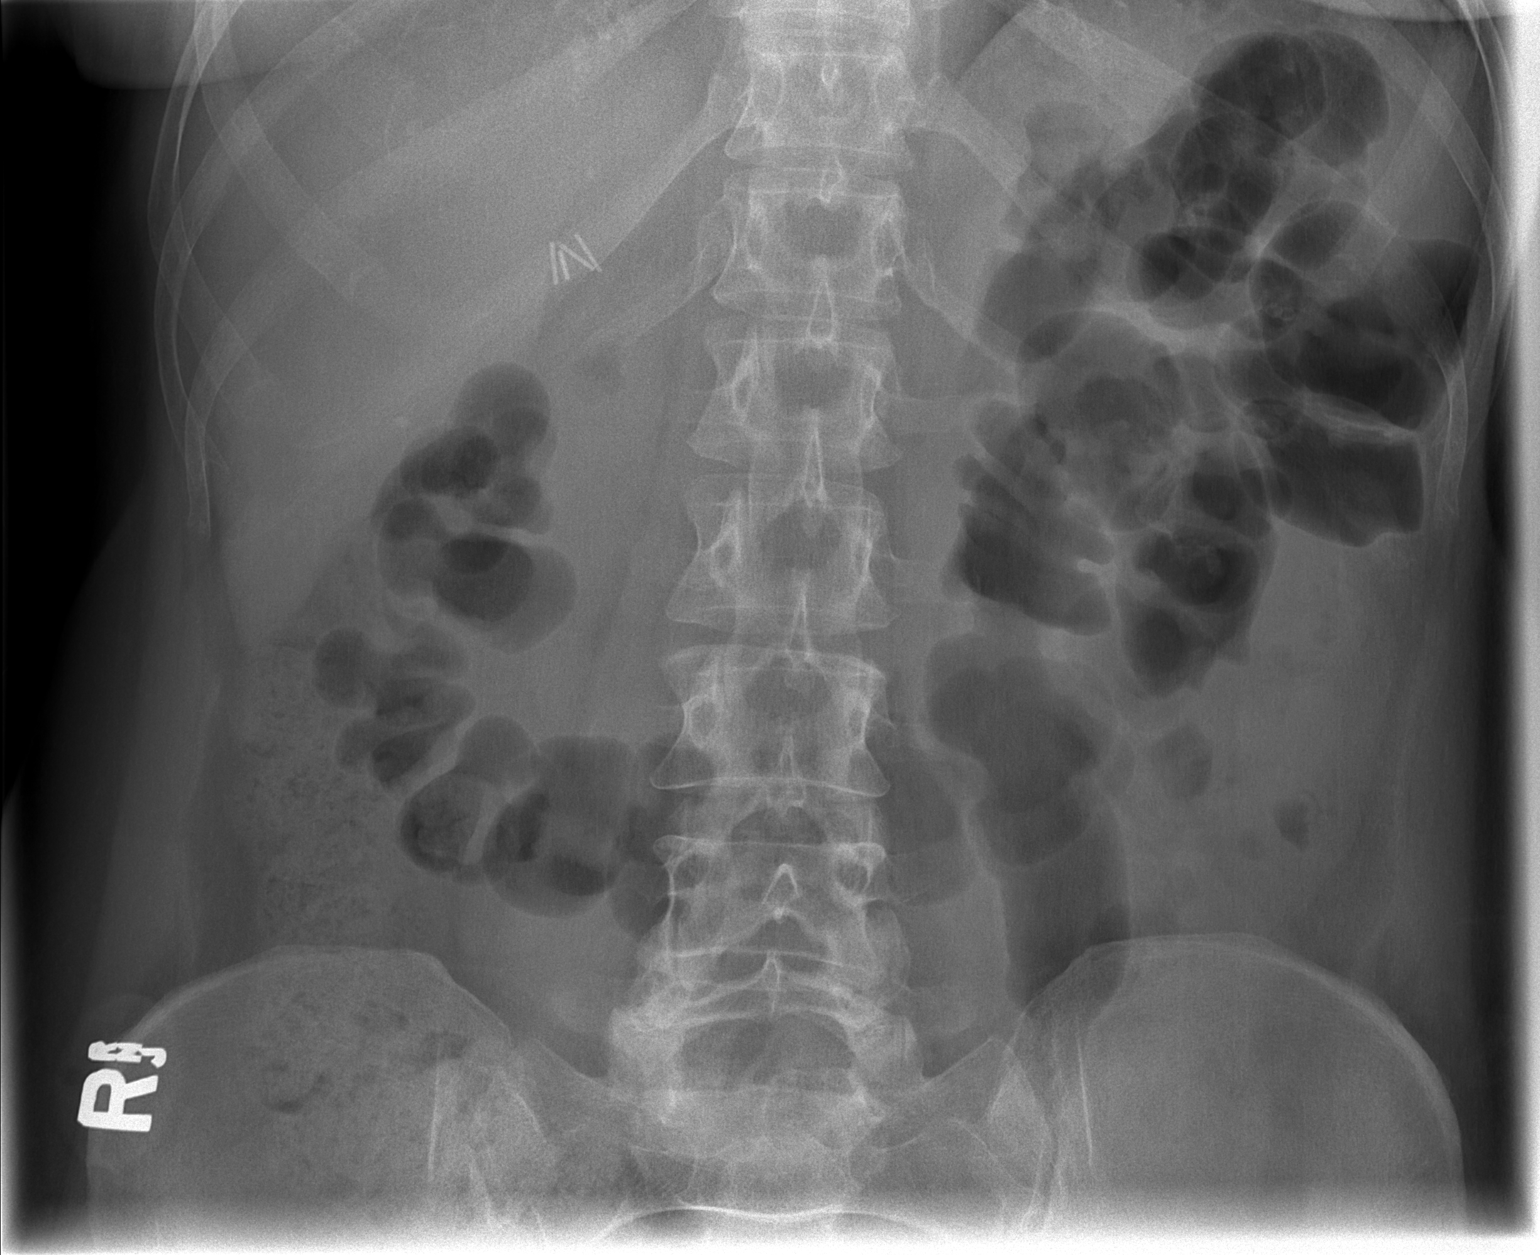

[2 of 2 positions shown; findings below may reference images not displayed]

FINDINGS: The bowel gas pattern is normal. No radio-opaque calculi or other
significant radiographic abnormality are seen. Right-sided distal
ureteral calculus seen on prior CT is not appreciated
radiographically.
IMPRESSION: 1. Nonobstructive pattern of bowel gas. No free air in the abdomen
on supine radiographs.

2. Right-sided distal ureteral calculus seen on prior CT is not
appreciated radiographically.

## 2019-11-10 ENCOUNTER — Other Ambulatory Visit: Payer: Self-pay | Admitting: Family Medicine

## 2019-11-10 DIAGNOSIS — Z1231 Encounter for screening mammogram for malignant neoplasm of breast: Secondary | ICD-10-CM

## 2022-02-13 ENCOUNTER — Other Ambulatory Visit: Payer: Self-pay | Admitting: Obstetrics and Gynecology

## 2022-02-13 DIAGNOSIS — R928 Other abnormal and inconclusive findings on diagnostic imaging of breast: Secondary | ICD-10-CM

## 2022-02-20 ENCOUNTER — Other Ambulatory Visit: Payer: Self-pay | Admitting: Obstetrics and Gynecology

## 2022-02-20 ENCOUNTER — Ambulatory Visit
Admission: RE | Admit: 2022-02-20 | Discharge: 2022-02-20 | Disposition: A | Discharge status: Home / Self Care | Payer: Commercial Managed Care - PPO | Source: Ambulatory Visit | Attending: Obstetrics and Gynecology | Admitting: Obstetrics and Gynecology

## 2022-02-20 DIAGNOSIS — R928 Other abnormal and inconclusive findings on diagnostic imaging of breast: Secondary | ICD-10-CM

## 2022-02-28 ENCOUNTER — Inpatient Hospital Stay: Admission: RE | Admit: 2022-02-28 | Payer: Commercial Managed Care - PPO | Source: Ambulatory Visit

## 2022-03-02 ENCOUNTER — Ambulatory Visit
Admission: RE | Admit: 2022-03-02 | Discharge: 2022-03-02 | Disposition: A | Discharge status: Home / Self Care | Payer: Commercial Managed Care - PPO | Source: Ambulatory Visit | Attending: Obstetrics and Gynecology | Admitting: Obstetrics and Gynecology

## 2022-03-02 DIAGNOSIS — R928 Other abnormal and inconclusive findings on diagnostic imaging of breast: Secondary | ICD-10-CM

## 2022-03-02 DIAGNOSIS — C801 Malignant (primary) neoplasm, unspecified: Secondary | ICD-10-CM

## 2022-03-02 HISTORY — DX: Malignant (primary) neoplasm, unspecified: C80.1

## 2022-03-02 HISTORY — PX: BREAST BIOPSY: SHX20

## 2022-03-09 ENCOUNTER — Other Ambulatory Visit: Payer: Self-pay | Admitting: Surgery

## 2022-03-09 DIAGNOSIS — D0512 Intraductal carcinoma in situ of left breast: Secondary | ICD-10-CM

## 2022-03-13 ENCOUNTER — Other Ambulatory Visit: Payer: Self-pay | Admitting: Surgery

## 2022-03-13 DIAGNOSIS — D0512 Intraductal carcinoma in situ of left breast: Secondary | ICD-10-CM

## 2022-03-15 ENCOUNTER — Telehealth: Payer: Self-pay | Admitting: Radiation Oncology

## 2022-03-15 ENCOUNTER — Encounter: Payer: Self-pay | Admitting: *Deleted

## 2022-03-15 DIAGNOSIS — D0512 Intraductal carcinoma in situ of left breast: Secondary | ICD-10-CM

## 2022-03-15 NOTE — Telephone Encounter (Signed)
Spoke with pt who advised she had already spoken with someone here @ [facility]. Unsure who this was since no note was found in chart. Pt unsure if she wants to receive tx here due to distance from home. Pt stated she wanted to discuss this with her husband before deciding to proceed. Pt will c/b tomorrow. Pt encouraged to LVM if I am not available.

## 2022-03-19 ENCOUNTER — Encounter: Payer: Self-pay | Admitting: *Deleted

## 2022-03-20 ENCOUNTER — Encounter: Payer: Self-pay | Admitting: *Deleted

## 2022-03-20 NOTE — Progress Notes (Signed)
PATIENT NAVIGATOR PROGRESS NOTE  Name: Jennifer Cantu Date: 03/20/2022 MRN: [id number]  DOB: Aug 28, 1965   Reason for visit:  Introductory phone call  Comments:  Called and spoke with patient regarding new patient appt with Dr Benay Spice. Visit will be 2 weeks after surgery on October 19 at 1:40pm. Directions to building and parking reviewed with patient    Time spent counseling/coordinating care: 30-45 minutes

## 2022-03-23 ENCOUNTER — Encounter: Payer: Self-pay | Admitting: *Deleted

## 2022-03-27 NOTE — Progress Notes (Signed)
Location of Breast Cancer: left breast  Histology per Pathology Report: Diagnosis Breast, left, needle core biopsy, upper outer - DUCTAL CARCINOMA IN SITU, LOW GRADE, CRIBRIFORM - NECROSIS: PRESENT, FOCAL - CALCIFICATIONS: PRESENT - DCIS LENGTH: 0.5 CM  Receptor Status: Estrogen Receptor: 95%, POSITIVE, STRONG STAINING INTENSITY Progesterone Receptor: 80%, POSITIVE, STRONG STAINING INTENSITY  Did patient present with symptoms (if so, please note symptoms) or was this found on screening mammography?: Yes  Past/Anticipated interventions by surgeon, if any:04/12/2022   LEFT BREAST LUMPECTOMY WITH RADIOACTIVE SEED LOCALIZATION  Past/Anticipated interventions by medical oncology, if any: Chemotherapy   Lymphedema issues, if any:  No  Pain issues, if any:  No SAFETY ISSUES: Prior radiation? no Pacemaker/ICD? no Possible current pregnancy? Is the patient on methotrexate? no  Current Complaints / other details:   Vitals:   04/03/22 1042  BP: (!) 138/91  Pulse: 63  Resp: 18  Temp: 97.8 F (36.6 C)  TempSrc: Oral  SpO2: 100%  Weight: 58.2 kg

## 2022-03-28 ENCOUNTER — Ambulatory Visit: Payer: Commercial Managed Care - PPO | Admitting: Radiation Oncology

## 2022-03-28 ENCOUNTER — Ambulatory Visit: Payer: Commercial Managed Care - PPO

## 2022-04-02 NOTE — Progress Notes (Signed)
Radiation Oncology         [phone] ________________________________  Initial Outpatient Consultation  Name: Jennifer Cantu MRN: [id number]  Date: 04/03/2022  DOB: 02-27-1966  QI:[id number], Aundra Millet, Eber Hong, MD   REFERRING PHYSICIAN: Coralie Keens, MD  DIAGNOSIS:    ICD-10-CM   1. 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 Ductal carcinoma in situ (DCIS) of left breast  D05.12      Stage 0 (cTis (DCIS), cN0, cM0) Left Breast UOQ, Low-grade DCIS, ER+ / PR+ / Her2 not assessed  CHIEF COMPLAINT: Here to discuss management of left breast DCIS  HISTORY OF PRESENT ILLNESS::Jennifer Cantu is a 56 y.o. female who presented with a left breast abnormality on the following imaging: bilateral screening mammogram on an unknown date. No symptoms, if any, were reported at that time. Left breast diagnostic mammogram on 02/20/22 showed a group of indeterminate microcalcifications over the upper outer quadrant of the left breast, spanning an area measuring approximately 1.6 cm. No abnormal axillary lymph nodes were appreciated, however this is in the absence of a targeted ultrasound.       Biopsy of the upper outer left breast on date of 03/02/22 showed low-grade ductal carcinoma in-situ measuring 0.5 cm, with focal necrosis and calcifications.  ER status: 95% positive and PR status 80% positive, both with strong staining intensity; Her2 not assessed. No lymph nodes were examined.   Accordingly, the patient was referred to Dr. Ninfa Linden on 03/09/22 to review surgical treatment options. Following discussion, the patient agreed to proceed with breast conserving surgery. She is scheduled for surgery on 04/12/22 with Dr. Ninfa Linden.   She recently retired.  She lost her mother to a sudden death 2 years ago and is still grieving that loss.  She is here with her husband who appears very supportive.  They live in [place].  She is still deciding whether she would like  to get her treatment closer to home.  Their son is training to be a physical therapist at [employer].  PREVIOUS RADIATION THERAPY: No  PAST MEDICAL HISTORY:  has a past medical history of Corneal erosion of both eyes, DJD (degenerative joint disease), High serum ferritin, and History of kidney stones.    PAST SURGICAL HISTORY: Past Surgical History:  Procedure Laterality Date   ABDOMINAL SURGERY     CHOLECYSTECTOMY     LAP   CYSTOSCOPY WITH RETROGRADE PYELOGRAM, URETEROSCOPY AND STENT PLACEMENT Right 11/25/2018   Procedure: CYSTOSCOPY WITH RIGHT RETROGRADE URETEROSCOPY, STONE EXTRACTION;  Surgeon: Irine Seal, MD;  Location: [facility];  Service: Urology;  Laterality: Right;   WISDOM TOOTH EXTRACTION Bilateral    "when I was young"    FAMILY HISTORY: family history is not on file.  SOCIAL HISTORY:  reports that she has never smoked. She has never used smokeless tobacco. She reports that she does not currently use alcohol. She reports that she does not use drugs.  ALLERGIES: Patient has no known allergies.  MEDICATIONS:  Current Outpatient Medications  Medication Sig Dispense Refill   ALPRAZolam (XANAX) 0.5 MG tablet Take 0.5 mg by mouth at bedtime.      ibuprofen (ADVIL) 200 MG tablet Take 600-800 mg by mouth every 8 (eight) hours as needed (for pain.).     levonorgestrel (MIRENA) 20 MCG/24HR IUD 1 each by Intrauterine route once. INSERTED 2018     acetaminophen (TYLENOL) 500 MG tablet Take 500 mg by mouth every 6 (six)  hours as needed. TAKES 1 TO 2 (Patient not taking: Reported on 04/03/2022)     clobetasol ointment (TEMOVATE) 2.20 % Apply 1 application topically 2 (two) times daily as needed (skin irritation.).  (Patient not taking: Reported on 04/03/2022)     FIBER SELECT GUMMIES PO Take 2 tablets by mouth 3 (three) times a week. (Patient not taking: Reported on 04/03/2022)     Multiple Vitamins-Minerals (ADULT GUMMY PO) Take 2 tablets by mouth 3 (three) times a week. (Patient not  taking: Reported on 04/03/2022)     No current facility-administered medications for this encounter.    REVIEW OF SYSTEMS: As above in HPI.   PHYSICAL EXAM:  weight is 128 lb 4 oz (58.2 kg). Her oral temperature is 97.8 F (36.6 C). Her blood pressure is 138/91 (abnormal) and her pulse is 63. Her respiration is 18 and oxygen saturation is 100%.   General: Alert and oriented, in no acute distress HEENT: Head is normocephalic. Extraocular movements are intact.  Heart: Regular in rate and rhythm with no murmurs, rubs, or gallops. Chest: Clear to auscultation bilaterally, with no rhonchi, wheezes, or rales. Abdomen: Soft, nontender, nondistended, with no rigidity or guarding. Extremities: No cyanosis or edema. Lymphatics: see Neck Exam Skin: No concerning lesions. Musculoskeletal: symmetric strength and muscle tone throughout. Neurologic: Cranial nerves II through XII are grossly intact. No obvious focalities. Speech is fluent. Coordination is intact. Psychiatric: Judgment and insight are intact. Affect is appropriate. Breasts: No palpable masses in the left breast.     ECOG = 0  0 - Asymptomatic (Fully active, able to carry on all predisease activities without restriction)  1 - Symptomatic but completely ambulatory (Restricted in physically strenuous activity but ambulatory and able to carry out work of a light or sedentary nature. For example, light housework, office work)  2 - Symptomatic, <50% in bed during the day (Ambulatory and capable of all self care but unable to carry out any work activities. Up and about more than 50% of waking hours)  3 - Symptomatic, >50% in bed, but not bedbound (Capable of only limited self-care, confined to bed or chair 50% or more of waking hours)  4 - Bedbound (Completely disabled. Cannot carry on any self-care. Totally confined to bed or chair)  5 - Death   Eustace Pen MM, Creech RH, Tormey DC, et al. [phone]). "Toxicity and response criteria of the  [facility]". [place] Oncol. 5 (6): 649-55   LABORATORY DATA:  Lab Results  Component Value Date   WBC 6.1 01/14/2019   HGB 12.9 01/14/2019   HCT 40.2 01/14/2019   MCV 93.5 01/14/2019   PLT 199 01/14/2019   CMP     Component Value Date/Time   NA 138 11/24/2018 1310   K 4.5 11/24/2018 1310   CL 103 11/24/2018 1310   CO2 28 11/24/2018 1310   GLUCOSE 97 11/24/2018 1310   BUN 13 11/24/2018 1310   CREATININE 1.27 (H) 11/24/2018 1310   CALCIUM 8.9 11/24/2018 1310   PROT 7.1 11/02/2018 0221   ALBUMIN 4.5 11/02/2018 0221   AST 23 11/02/2018 0221   ALT 16 11/02/2018 0221   ALKPHOS 40 11/02/2018 0221   BILITOT 0.4 11/02/2018 0221   GFRNONAA 49 (L) 11/24/2018 1310   GFRAA 57 (L) 11/24/2018 1310       RADIOGRAPHY: As above    IMPRESSION/PLAN: DCIS, left breast  It was a pleasure meeting the patient today. We discussed the risks, benefits, and side  effects of radiotherapy. I recommend standard hypofractionated radiotherapy to the left breast to reduce her risk of locoregional recurrence by half.  We discussed that radiation would take approximately 4 weeks to complete and that I would give the patient a few weeks to heal following surgery before starting treatment planning.  We spoke about acute effects including skin irritation and fatigue as well as much less common late effects including internal organ injury or irritation. We spoke about the latest technology (including DIBH breathhold techniques) that is used to minimize the risk of late effects for patients undergoing radiotherapy to the breast or chest wall. No guarantees of treatment were given. The patient is enthusiastic about proceeding with treatment. I look forward to participating in the patient's care.  I will await her referral back to me for postoperative follow-up and eventual CT simulation/treatment planning.  Of note she is still deciding whether she would like to get her treatment closer to  home in [facility].  If she chooses to get treatment closer to home she will let Dr. Ninfa Linden know so that a referral can be made accordingly.  I gave her my contact information if any questions arise in the future.  On date of service, in total, I spent 45 minutes on this encounter. Patient was seen in person.   __________________________________________   Eppie Gibson, MD  This document serves as a record of services personally performed by Eppie Gibson, MD. It was created on her behalf by Roney Mans, a trained medical scribe. The creation of this record is based on the scribe's personal observations and the provider's statements to them. This document has been checked and approved by the attending provider.

## 2022-04-03 ENCOUNTER — Ambulatory Visit
Admission: RE | Admit: 2022-04-03 | Discharge: 2022-04-03 | Disposition: A | Discharge status: Home / Self Care | Payer: Commercial Managed Care - PPO | Source: Ambulatory Visit | Attending: Radiation Oncology | Admitting: Radiation Oncology

## 2022-04-03 ENCOUNTER — Other Ambulatory Visit: Payer: Self-pay

## 2022-04-03 ENCOUNTER — Encounter: Payer: Self-pay | Admitting: Radiation Oncology

## 2022-04-03 VITALS — BP 138/91 | HR 63 | Temp 97.8°F | Resp 18 | Wt 128.2 lb

## 2022-04-03 DIAGNOSIS — Z170 Estrogen receptor positive status [ER+]: Secondary | ICD-10-CM | POA: Diagnosis not present

## 2022-04-03 DIAGNOSIS — D0512 Intraductal carcinoma in situ of left breast: Secondary | ICD-10-CM | POA: Diagnosis not present

## 2022-04-03 DIAGNOSIS — Z79899 Other long term (current) drug therapy: Secondary | ICD-10-CM | POA: Insufficient documentation

## 2022-04-03 NOTE — Addendum Note (Signed)
Encounter addended by: Sherrlyn Hock, RN on: 04/03/2022 1:04 PM  Actions taken: Allergies reviewed, Charge Capture section accepted

## 2022-04-06 ENCOUNTER — Encounter (HOSPITAL_BASED_OUTPATIENT_CLINIC_OR_DEPARTMENT_OTHER): Payer: Self-pay | Admitting: Surgery

## 2022-04-06 ENCOUNTER — Other Ambulatory Visit: Payer: Self-pay

## 2022-04-11 ENCOUNTER — Ambulatory Visit
Admission: RE | Admit: 2022-04-11 | Discharge: 2022-04-11 | Disposition: A | Discharge status: Home / Self Care | Payer: Commercial Managed Care - PPO | Source: Ambulatory Visit | Attending: Surgery | Admitting: Surgery

## 2022-04-11 DIAGNOSIS — D0512 Intraductal carcinoma in situ of left breast: Secondary | ICD-10-CM

## 2022-04-11 MED ORDER — ENSURE PRE-SURGERY PO LIQD: 296.0000 mL | Freq: Once | ORAL | Status: DC

## 2022-04-11 MED ORDER — CHLORHEXIDINE GLUCONATE CLOTH 2 % EX PADS: 6.0000 | MEDICATED_PAD | Freq: Once | CUTANEOUS | Status: DC

## 2022-04-11 NOTE — H&P (Signed)
REFERRING PHYSICIAN: Cyril Mourning, MD  PROVIDER: Beverlee Nims, MD  MRN: [id number] DOB: June 16, 1966  Subjective   Chief Complaint: New Consultation (Left Breast )   History of Present Illness: Jennifer Cantu is a 56 y.o. female who is seen  as an office consultation for evaluation of New Consultation (Left Breast ) .   This is a 56 year old female referred here after the recent diagnosis of ductal carcinoma in situ of the left breast. She had undergone screening mammography which showed a 1.6 cm area of calcifications in the upper outer quadrant of the left breast. She underwent a biopsy showing a low-grade ductal carcinoma in situ. It was 95% ER positive, and 80% PR positive. She has had no problems regarding her breast. She has had no previous procedures on her breast. She denies nipple discharge. There is no family history of breast cancer  Review of Systems: A complete review of systems was obtained from the patient. I have reviewed this information and discussed as appropriate with the patient. See HPI as well for other ROS.  ROS   Medical History: Past Medical History:  Diagnosis Date  Arthritis  History of cancer   Patient Active Problem List  Diagnosis  Abnormal cervical Papanicolaou smear  Arthritis  Elevated ferritin  Malignant tumor of breast (CMS-HCC)  Positive ANA (antinuclear antibody)   Past Surgical History:  Procedure Laterality Date  CHOLECYSTECTOMY 1993  Kidney Stones 11/2018    Allergies  Allergen Reactions  Lifitegrast Unknown   Current Outpatient Medications on File Prior to Visit  Medication Sig Dispense Refill  ALPRAZolam (XANAX) 0.5 MG tablet TAKE (1) TABLET BY MOUTH THREE TIMES DAILY AS NEEDED.  PREMARIN 0.9 mg tablet Take 1 tablet by mouth once daily  TYRVAYA 0.03 mg/spray sprm USE NASAL TWICE A DAY   No current facility-administered medications on file prior to visit.   History reviewed. No pertinent family history.    Social History   Tobacco Use  Smoking Status Never  Smokeless Tobacco Never    Social History   Socioeconomic History  Marital status: Married  Tobacco Use  Smoking status: Never  Smokeless tobacco: Never  Substance and Sexual Activity  Alcohol use: Not Currently  Drug use: Never   Objective:   Vitals:   BP: (!) 160/90  Pulse: 60  Temp: 36.2 C (97.1 F)  SpO2: 95%  Weight: 58.6 kg (129 lb 3.2 oz)  Height: 162.6 cm ('5\' 4"'$ )   Body mass index is 22.18 kg/m.  Physical Exam   She appears well on exam  Breasts are normal in appearance. There are no palpable masses. The nipple areolar complexes are normal.  There is no axillary adenopathy.  Labs, Imaging and Diagnostic Testing: I have reviewed her mammograms, ultrasound, and pathology results  Assessment and Plan:   Diagnoses and all orders for this visit:  Ductal carcinoma in situ (DCIS) of left breast - Ambulatory Referral to Radiation Oncology - Ambulatory Referral to Oncology-Medical    I gave the patient and her husband a copy of the pathology results. We discussed breast cancer in detail. We discussed DCIS. We next discussed the multidisciplinary approach to breast cancer which includes surgery, medical oncology, and often radiation oncology. We discussed surgery on the breast which includes breast conservation versus mastectomy. She is interested in breast conservation. We next discussed proceeding with a radioactive seed guided left breast lumpectomy. I explained the surgical procedure in detail. We discussed the risk of surgery which includes but  is not limited to bleeding, infection, the need for further surgery if margins are positive, seroma formation, postoperative recovery, cardiopulmonary issues, etc.  We will refer her to the cancer center to see medical and radiation oncology. Surgery will be the first line of treatment. They understand and agree with the plans. Surgery will be scheduled

## 2022-04-11 NOTE — Progress Notes (Signed)
      Enhanced Recovery after Surgery  Enhanced Recovery after Surgery is a protocol used to improve the stress on your body and your recovery after surgery.  Patient Instructions  The night before surgery:  No food after midnight. ONLY clear liquids after midnight  The day of surgery (if you do NOT have diabetes):  Drink ONE (1) Pre-Surgery Clear Ensure as directed.   This drink was given to you during your hospital  pre-op appointment visit. The pre-op nurse will instruct you on the time to drink the  Pre-Surgery Ensure depending on your surgery time. Finish the drink at the designated time by the pre-op nurse.  Nothing else to drink after completing the  Pre-Surgery Clear Ensure.  The day of surgery (if you have diabetes): Drink ONE (1) Gatorade 2 (G2) as directed. This drink was given to you during your hospital  pre-op appointment visit.  The pre-op nurse will instruct you on the time to drink the   Gatorade 2 (G2) depending on your surgery time. Color of the Gatorade may vary. Red is not allowed. Nothing else to drink after completing the  Gatorade 2 (G2).         If you have questions, please contact your surgeon's office.  Surgical soap given with written instructions 

## 2022-04-12 ENCOUNTER — Encounter (HOSPITAL_BASED_OUTPATIENT_CLINIC_OR_DEPARTMENT_OTHER)
Admission: RE | Disposition: A | Discharge status: Home / Self Care | Payer: Self-pay | Source: Ambulatory Visit | Attending: Surgery

## 2022-04-12 ENCOUNTER — Encounter (HOSPITAL_BASED_OUTPATIENT_CLINIC_OR_DEPARTMENT_OTHER): Payer: Self-pay | Admitting: Surgery

## 2022-04-12 ENCOUNTER — Other Ambulatory Visit: Payer: Self-pay

## 2022-04-12 ENCOUNTER — Ambulatory Visit
Admission: RE | Admit: 2022-04-12 | Discharge: 2022-04-12 | Disposition: A | Discharge status: Home / Self Care | Payer: Commercial Managed Care - PPO | Source: Ambulatory Visit | Attending: Surgery | Admitting: Surgery

## 2022-04-12 ENCOUNTER — Ambulatory Visit (HOSPITAL_BASED_OUTPATIENT_CLINIC_OR_DEPARTMENT_OTHER): Payer: Commercial Managed Care - PPO | Admitting: Anesthesiology

## 2022-04-12 ENCOUNTER — Ambulatory Visit (HOSPITAL_BASED_OUTPATIENT_CLINIC_OR_DEPARTMENT_OTHER)
Admission: RE | Admit: 2022-04-12 | Discharge: 2022-04-12 | Disposition: A | Discharge status: Home / Self Care | Payer: Commercial Managed Care - PPO | Source: Ambulatory Visit | Attending: Surgery | Admitting: Surgery

## 2022-04-12 DIAGNOSIS — Z170 Estrogen receptor positive status [ER+]: Secondary | ICD-10-CM | POA: Insufficient documentation

## 2022-04-12 DIAGNOSIS — F419 Anxiety disorder, unspecified: Secondary | ICD-10-CM | POA: Diagnosis not present

## 2022-04-12 DIAGNOSIS — D0512 Intraductal carcinoma in situ of left breast: Secondary | ICD-10-CM | POA: Insufficient documentation

## 2022-04-12 DIAGNOSIS — Z01818 Encounter for other preprocedural examination: Secondary | ICD-10-CM

## 2022-04-12 HISTORY — PX: BREAST LUMPECTOMY WITH RADIOACTIVE SEED LOCALIZATION: SHX6424

## 2022-04-12 HISTORY — PX: BREAST LUMPECTOMY: SHX2

## 2022-04-12 LAB — POCT PREGNANCY, URINE: Preg Test, Ur: NEGATIVE

## 2022-04-12 SURGERY — BREAST LUMPECTOMY WITH RADIOACTIVE SEED LOCALIZATION
Anesthesia: General | Site: Breast | Laterality: Left

## 2022-04-12 MED ORDER — MEPERIDINE HCL 25 MG/ML IJ SOLN: 6.2500 mg | INTRAMUSCULAR | Status: DC | PRN

## 2022-04-12 MED ORDER — DEXAMETHASONE SODIUM PHOSPHATE 10 MG/ML IJ SOLN
INTRAMUSCULAR | Status: DC | PRN
  Administered 2022-04-12: 10 mg via INTRAVENOUS

## 2022-04-12 MED ORDER — CEFAZOLIN SODIUM-DEXTROSE 2-4 GM/100ML-% IV SOLN
INTRAVENOUS | Status: AC
  Filled 2022-04-12: qty 100

## 2022-04-12 MED ORDER — TRAMADOL HCL 50 MG PO TABS: 50.0000 mg | ORAL_TABLET | Freq: Four times a day (QID) | ORAL | 0 refills | Status: DC | PRN

## 2022-04-12 MED ORDER — ACETAMINOPHEN 500 MG PO TABS
1000.0000 mg | ORAL_TABLET | ORAL | Status: AC
  Administered 2022-04-12: 1000 mg via ORAL

## 2022-04-12 MED ORDER — ONDANSETRON HCL 4 MG/2ML IJ SOLN
INTRAMUSCULAR | Status: DC | PRN
  Administered 2022-04-12: 4 mg via INTRAVENOUS

## 2022-04-12 MED ORDER — BUPIVACAINE-EPINEPHRINE 0.5% -1:200000 IJ SOLN
INTRAMUSCULAR | Status: DC | PRN
  Administered 2022-04-12: 20 mL

## 2022-04-12 MED ORDER — EPHEDRINE SULFATE (PRESSORS) 50 MG/ML IJ SOLN
INTRAMUSCULAR | Status: DC | PRN
  Administered 2022-04-12: 10 mg via INTRAVENOUS

## 2022-04-12 MED ORDER — MIDAZOLAM HCL 5 MG/5ML IJ SOLN
INTRAMUSCULAR | Status: DC | PRN
  Administered 2022-04-12: 2 mg via INTRAVENOUS

## 2022-04-12 MED ORDER — OXYCODONE HCL 5 MG/5ML PO SOLN: 5.0000 mg | Freq: Once | ORAL | Status: DC | PRN

## 2022-04-12 MED ORDER — ACETAMINOPHEN 500 MG PO TABS
ORAL_TABLET | ORAL | Status: AC
  Filled 2022-04-12: qty 2

## 2022-04-12 MED ORDER — HYDROMORPHONE HCL 1 MG/ML IJ SOLN
0.2500 mg | INTRAMUSCULAR | Status: DC | PRN
  Administered 2022-04-12: 0.5 mg via INTRAVENOUS

## 2022-04-12 MED ORDER — LACTATED RINGERS IV SOLN: INTRAVENOUS | Status: DC

## 2022-04-12 MED ORDER — PROMETHAZINE HCL 25 MG/ML IJ SOLN: 6.2500 mg | INTRAMUSCULAR | Status: DC | PRN

## 2022-04-12 MED ORDER — HYDROMORPHONE HCL 1 MG/ML IJ SOLN
INTRAMUSCULAR | Status: AC
  Filled 2022-04-12: qty 0.5

## 2022-04-12 MED ORDER — AMISULPRIDE (ANTIEMETIC) 5 MG/2ML IV SOLN: 10.0000 mg | Freq: Once | INTRAVENOUS | Status: DC | PRN

## 2022-04-12 MED ORDER — ONDANSETRON HCL 4 MG/2ML IJ SOLN
INTRAMUSCULAR | Status: AC
  Filled 2022-04-12: qty 2

## 2022-04-12 MED ORDER — MIDAZOLAM HCL 2 MG/2ML IJ SOLN
INTRAMUSCULAR | Status: AC
  Filled 2022-04-12: qty 2

## 2022-04-12 MED ORDER — LIDOCAINE HCL (CARDIAC) PF 100 MG/5ML IV SOSY
PREFILLED_SYRINGE | INTRAVENOUS | Status: DC | PRN
  Administered 2022-04-12: 60 mg via INTRATRACHEAL

## 2022-04-12 MED ORDER — FENTANYL CITRATE (PF) 100 MCG/2ML IJ SOLN
INTRAMUSCULAR | Status: AC
  Filled 2022-04-12: qty 2

## 2022-04-12 MED ORDER — CEFAZOLIN SODIUM-DEXTROSE 2-4 GM/100ML-% IV SOLN
2.0000 g | INTRAVENOUS | Status: AC
  Administered 2022-04-12: 2 g via INTRAVENOUS

## 2022-04-12 MED ORDER — OXYCODONE HCL 5 MG PO TABS: 5.0000 mg | ORAL_TABLET | Freq: Once | ORAL | Status: DC | PRN

## 2022-04-12 MED ORDER — PROPOFOL 10 MG/ML IV BOLUS
INTRAVENOUS | Status: DC | PRN
  Administered 2022-04-12: 200 mg via INTRAVENOUS

## 2022-04-12 MED ORDER — FENTANYL CITRATE (PF) 100 MCG/2ML IJ SOLN
INTRAMUSCULAR | Status: DC | PRN
  Administered 2022-04-12: 50 ug via INTRAVENOUS

## 2022-04-12 SURGICAL SUPPLY — 48 items
ADH SKN CLS APL DERMABOND .7 (GAUZE/BANDAGES/DRESSINGS) ×1
APL PRP STRL LF DISP 70% ISPRP (MISCELLANEOUS) ×1
APPLIER CLIP 9.375 MED OPEN (MISCELLANEOUS) ×1
APR CLP MED 9.3 20 MLT OPN (MISCELLANEOUS) ×1
BINDER BREAST 3XL (GAUZE/BANDAGES/DRESSINGS) IMPLANT
BINDER BREAST LRG (GAUZE/BANDAGES/DRESSINGS) IMPLANT
BINDER BREAST MEDIUM (GAUZE/BANDAGES/DRESSINGS) IMPLANT
BINDER BREAST XLRG (GAUZE/BANDAGES/DRESSINGS) IMPLANT
BINDER BREAST XXLRG (GAUZE/BANDAGES/DRESSINGS) IMPLANT
BLADE SURG 15 STRL LF DISP TIS (BLADE) ×2 IMPLANT
BLADE SURG 15 STRL SS (BLADE) ×1
CANISTER SUC SOCK COL 7IN (MISCELLANEOUS) IMPLANT
CANISTER SUCT 1200ML W/VALVE (MISCELLANEOUS) IMPLANT
CHLORAPREP W/TINT 26 (MISCELLANEOUS) ×2 IMPLANT
CLIP APPLIE 9.375 MED OPEN (MISCELLANEOUS) IMPLANT
COVER BACK TABLE 60X90IN (DRAPES) ×2 IMPLANT
COVER MAYO STAND STRL (DRAPES) ×2 IMPLANT
COVER PROBE W GEL 5X96 (DRAPES) ×2 IMPLANT
DERMABOND ADVANCED .7 DNX12 (GAUZE/BANDAGES/DRESSINGS) ×2 IMPLANT
DRAPE LAPAROSCOPIC ABDOMINAL (DRAPES) ×2 IMPLANT
DRAPE UTILITY XL STRL (DRAPES) ×2 IMPLANT
ELECT REM PT RETURN 9FT ADLT (ELECTROSURGICAL) ×1
ELECTRODE REM PT RTRN 9FT ADLT (ELECTROSURGICAL) ×2 IMPLANT
GAUZE SPONGE 4X4 12PLY STRL LF (GAUZE/BANDAGES/DRESSINGS) IMPLANT
GLOVE BIOGEL PI IND STRL 7.0 (GLOVE) IMPLANT
GLOVE SURG SIGNA 7.5 PF LTX (GLOVE) ×2 IMPLANT
GOWN STRL REUS W/ TWL LRG LVL3 (GOWN DISPOSABLE) ×2 IMPLANT
GOWN STRL REUS W/ TWL XL LVL3 (GOWN DISPOSABLE) ×2 IMPLANT
GOWN STRL REUS W/TWL LRG LVL3 (GOWN DISPOSABLE) ×2
GOWN STRL REUS W/TWL XL LVL3 (GOWN DISPOSABLE) ×1
KIT MARKER MARGIN INK (KITS) ×2 IMPLANT
NDL HYPO 25X1 1.5 SAFETY (NEEDLE) ×2 IMPLANT
NEEDLE HYPO 25X1 1.5 SAFETY (NEEDLE) ×1 IMPLANT
NS IRRIG 1000ML POUR BTL (IV SOLUTION) IMPLANT
PACK BASIN DAY SURGERY FS (CUSTOM PROCEDURE TRAY) ×2 IMPLANT
PENCIL SMOKE EVACUATOR (MISCELLANEOUS) ×2 IMPLANT
SLEEVE SCD COMPRESS KNEE MED (STOCKING) ×2 IMPLANT
SPIKE FLUID TRANSFER (MISCELLANEOUS) IMPLANT
SPONGE T-LAP 4X18 ~~LOC~~+RFID (SPONGE) ×2 IMPLANT
SUT MNCRL AB 4-0 PS2 18 (SUTURE) ×2 IMPLANT
SUT SILK 2 0 SH (SUTURE) IMPLANT
SUT VIC AB 3-0 SH 27 (SUTURE) ×1
SUT VIC AB 3-0 SH 27X BRD (SUTURE) ×2 IMPLANT
SYR CONTROL 10ML LL (SYRINGE) ×2 IMPLANT
TOWEL GREEN STERILE FF (TOWEL DISPOSABLE) ×2 IMPLANT
TRAY FAXITRON CT DISP (TRAY / TRAY PROCEDURE) ×2 IMPLANT
TUBE CONNECTING 20X1/4 (TUBING) IMPLANT
YANKAUER SUCT BULB TIP NO VENT (SUCTIONS) IMPLANT

## 2022-04-12 NOTE — Anesthesia Procedure Notes (Signed)
Procedure Name: LMA Insertion Date/Time: 04/12/2022 8:22 AM  Performed by: Glory Buff, CRNAPre-anesthesia Checklist: Patient identified, Emergency Drugs available, Suction available and Patient being monitored Patient Re-evaluated:Patient Re-evaluated prior to induction Oxygen Delivery Method: Circle system utilized Preoxygenation: Pre-oxygenation with 100% oxygen Induction Type: IV induction LMA: LMA inserted LMA Size: 4.0 Number of attempts: 1 Placement Confirmation: positive ETCO2 Tube secured with: Tape Dental Injury: Teeth and Oropharynx as per pre-operative assessment

## 2022-04-12 NOTE — Anesthesia Postprocedure Evaluation (Signed)
Anesthesia Post Note  Patient: Tylin Stradley  Procedure(s) Performed: LEFT BREAST LUMPECTOMY WITH RADIOACTIVE SEED LOCALIZATION (Left: Breast)     Patient location during evaluation: PACU Anesthesia Type: General Level of consciousness: awake and alert Pain management: pain level controlled Vital Signs Assessment: post-procedure vital signs reviewed and stable Respiratory status: spontaneous breathing, nonlabored ventilation and respiratory function stable Cardiovascular status: blood pressure returned to baseline and stable Postop Assessment: no apparent nausea or vomiting Anesthetic complications: no   No notable events documented.  Last Vitals:  Vitals:   04/12/22 0930 04/12/22 0958  BP: 127/83 (!) 143/83  Pulse: 68 63  Resp: 15 20  Temp:  36.4 C  SpO2: 99% 94%    Last Pain:  Vitals:   04/12/22 0958  TempSrc: Oral  PainSc: 0-No pain                 Lynda Rainwater

## 2022-04-12 NOTE — Transfer of Care (Signed)
Immediate Anesthesia Transfer of Care Note  Patient: Jennifer Cantu  Procedure(s) Performed: LEFT BREAST LUMPECTOMY WITH RADIOACTIVE SEED LOCALIZATION (Left: Breast)  Patient Location: PACU  Anesthesia Type:General  Level of Consciousness: drowsy and patient cooperative  Airway & Oxygen Therapy: Patient Spontanous Breathing and Patient connected to face mask oxygen  Post-op Assessment: Report given to RN and Post -op Vital signs reviewed and stable  Post vital signs: Reviewed and stable  Last Vitals:  Vitals Value Taken Time  BP    Temp    Pulse 85 04/12/22 0858  Resp    SpO2 100 % 04/12/22 0858  Vitals shown include unvalidated device data.  Last Pain:  Vitals:   04/12/22 0740  TempSrc: Oral  PainSc: 0-No pain         Complications: No notable events documented.

## 2022-04-12 NOTE — Interval H&P Note (Signed)
History and Physical Interval Note: no change in H and P  04/12/2022 7:52 AM  Jennifer Cantu  has presented today for surgery, with the diagnosis of LEFT BREAST DCIS.  The various methods of treatment have been discussed with the patient and family. After consideration of risks, benefits and other options for treatment, the patient has consented to  Procedure(s): LEFT BREAST LUMPECTOMY WITH RADIOACTIVE SEED LOCALIZATION (Left) as a surgical intervention.  The patient's history has been reviewed, patient examined, no change in status, stable for surgery.  I have reviewed the patient's chart and labs.  Questions were answered to the patient's satisfaction.     Coralie Keens

## 2022-04-12 NOTE — Discharge Instructions (Addendum)
[facility] Office Phone Number [phone]  BREAST BIOPSY/ PARTIAL MASTECTOMY: POST OP INSTRUCTIONS  Always review your discharge instruction sheet given to you by the facility where your surgery was performed.  IF YOU HAVE DISABILITY OR FAMILY LEAVE FORMS, YOU MUST BRING THEM TO THE OFFICE FOR PROCESSING.  DO NOT GIVE THEM TO YOUR DOCTOR.  A prescription for pain medication may be given to you upon discharge.  Take your pain medication as prescribed, if needed.  If narcotic pain medicine is not needed, then you may take acetaminophen (Tylenol) or ibuprofen (Advil) as needed. Take your usually prescribed medications unless otherwise directed If you need a refill on your pain medication, please contact your pharmacy.  They will contact our office to request authorization.  Prescriptions will not be filled after 5pm or on week-ends. You should eat very light the first 24 hours after surgery, such as soup, crackers, pudding, etc.  Resume your normal diet the day after surgery. Most patients will experience some swelling and bruising in the breast.  Ice packs and a good support bra will help.  Swelling and bruising can take several days to resolve.  It is common to experience some constipation if taking pain medication after surgery.  Increasing fluid intake and taking a stool softener will usually help or prevent this problem from occurring.  A mild laxative (Milk of Magnesia or Miralax) should be taken according to package directions if there are no bowel movements after 48 hours. Unless discharge instructions indicate otherwise, you may remove your bandages 24-48 hours after surgery, and you may shower at that time.  You may have steri-strips (small skin tapes) in place directly over the incision.  These strips should be left on the skin for 7-10 days.  If your surgeon used skin glue on the incision, you may shower in 24 hours.  The glue will flake off over the next 2-3 weeks.  Any  sutures or staples will be removed at the office during your follow-up visit. ACTIVITIES:  You may resume regular daily activities (gradually increasing) beginning the next day.  Wearing a good support bra or sports bra minimizes pain and swelling.  You may have sexual intercourse when it is comfortable. You may drive when you no longer are taking prescription pain medication, you can comfortably wear a seatbelt, and you can safely maneuver your car and apply brakes. RETURN TO WORK:  ______________________________________________________________________________________ Dennis Bast should see your doctor in the office for a follow-up appointment approximately two weeks after your surgery.  Your doctor's nurse will typically make your follow-up appointment when she calls you with your pathology report.  Expect your pathology report 2-3 business days after your surgery.  You may call to check if you do not hear from [place] after three days. OTHER INSTRUCTIONS: YOU MAY REMOVE THE BINDER AND SHOWER STARTING TOMORROW.  REPLACE THE BINDER OR WEAR A SPORTS BRA FOR A FEW DAYS FOR COMFORT ICE PACK, TYLENOL, AND IBUPROFEN ALSO FOR PAIN NO VIGOROUS ACTIVITY FOR ONE WEEK _______________________________________________________________________________________________ _____________________________________________________________________________________________________________________________________ _____________________________________________________________________________________________________________________________________ _____________________________________________________________________________________________________________________________________  WHEN TO CALL YOUR DOCTOR: Fever over 101.0 Nausea and/or vomiting. Extreme swelling or bruising. Continued bleeding from incision. Increased pain, redness, or drainage from the incision.  The clinic staff is available to answer your questions during regular business  hours.  Please don't hesitate to call and ask to speak to one of the nurses for clinical concerns.  If you have a medical emergency, go to the nearest emergency room or call 911.  A surgeon from [facility] is always on call at the hospital.  For further questions, please visit [email or web address]      Post Anesthesia Home Care Instructions  Activity: Get plenty of rest for the remainder of the day. A responsible individual must stay with you for 24 hours following the procedure.  For the next 24 hours, DO NOT: -Drive a car -Paediatric nurse -Drink alcoholic beverages -Take any medication unless instructed by your physician -Make any legal decisions or sign important papers.  Meals: Start with liquid foods such as gelatin or soup. Progress to regular foods as tolerated. Avoid greasy, spicy, heavy foods. If nausea and/or vomiting occur, drink only clear liquids until the nausea and/or vomiting subsides. Call your physician if vomiting continues.  Special Instructions/Symptoms: Your throat may feel dry or sore from the anesthesia or the breathing tube placed in your throat during surgery. If this causes discomfort, gargle with warm salt water. The discomfort should disappear within 24 hours.  If you had a scopolamine patch placed behind your ear for the management of post- operative nausea and/or vomiting:  1. The medication in the patch is effective for 72 hours, after which it should be removed.  Wrap patch in a tissue and discard in the trash. Wash hands thoroughly with soap and water. 2. You may remove the patch earlier than 72 hours if you experience unpleasant side effects which may include dry mouth, dizziness or visual disturbances. 3. Avoid touching the patch. Wash your hands with soap and water after contact with the patch.     No tylenol until after 2:45pm today if needed.

## 2022-04-12 NOTE — Op Note (Signed)
LEFT BREAST LUMPECTOMY WITH RADIOACTIVE SEED LOCALIZATION  Procedure Note  Jennifer Cantu 04/12/2022   Pre-op Diagnosis: LEFT BREAST DCIS     Post-op Diagnosis: same  Procedure(s): LEFT BREAST LUMPECTOMY WITH RADIOACTIVE SEED LOCALIZATION  Surgeon(s): Coralie Keens, MD  Anesthesia: General  Staff:  Circulator: Ted Mcalpine, RN; Anson Crofts, RN Scrub Person: Lovett Sox, CST  Estimated Blood Loss: Minimal               Specimens: sent to path  Indications: This is a 56 year old female who was found to have calcifications in the upper outer quadrant of the left breast on screening mammography.  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  It was ER and PR positive.  After discussion with her and her husband the decision was made to proceed with a radioactive seed guided lumpectomy of the left breast  Procedure: The patient was brought to the operating room and identifies the correct patient.  She was placed supine on the operating room table and general anesthesia was induced.  Her left breast was then prepped and draped in the usual sterile fashion.  Using the neoprobe I located the radioactive seed in the upper outer quadrant of the breast several inches from the nipple.  This was approximately at the 3 o'clock position of the breast.  I anesthetized the lateral edge of the areola with Marcaine.  I then created a circumareolar incision with a scalpel.  I then dissected down to the breast tissue with electrocautery.  I neck started dissecting laterally toward the area of the seed.  With the aid of the neoprobe I was able to dissect laterally past the seed and then down to the chest wall.  I then dissected superiorly and inferiorly and then medially around the radioactive seed as well staying widely around it with the aid of the neoprobe.  I then completed a lumpectomy coming underneath the specimen with electrocautery.  Once I remove the lumpectomy specimen I  marked all margins with paint.  An x-ray was performed confirming that the radioactive seed and biopsy clip were in the center of the specimen.  The lumpectomy specimen was then sent to pathology for evaluation.  I injected further Marcaine into the lumpectomy cavity.  I placed surgical clips around the periphery of the cavity for marker purposes.  Hemostasis was achieved.  I then closed the subcutaneous tissue with interrupted 3-0 Vicryl sutures.  I closed the skin with a running 4-0 Monocryl.  Dermabond was then applied.  The patient was placed in a breast binder.  She tolerated the procedure well.  All counts were correct at the end of the procedure.  She was then extubated in the operating room and taken in stable condition to the recovery room.          Coralie Keens   Date: 04/12/2022  Time: 8:56 AM

## 2022-04-12 NOTE — Anesthesia Preprocedure Evaluation (Addendum)
Anesthesia Evaluation  Patient identified by MRN, date of birth, ID band Patient awake    Reviewed: Allergy & Precautions, NPO status , Patient's Chart, lab work & pertinent test results  Airway Mallampati: II  TM Distance: >3 FB Neck ROM: Full    Dental no notable dental hx.    Pulmonary neg pulmonary ROS,    Pulmonary exam normal breath sounds clear to auscultation       Cardiovascular negative cardio ROS Normal cardiovascular exam Rhythm:Regular Rate:Normal     Neuro/Psych Anxiety negative neurological ROS  negative psych ROS   GI/Hepatic negative GI ROS, Neg liver ROS,   Endo/Other  negative endocrine ROS  Renal/GU negative Renal ROS  negative genitourinary   Musculoskeletal  (+) Arthritis , Osteoarthritis,    Abdominal   Peds negative pediatric ROS (+)  Hematology negative hematology ROS (+)   Anesthesia Other Findings   Reproductive/Obstetrics negative OB ROS                             Anesthesia Physical  Anesthesia Plan  ASA: II  Anesthesia Plan: General   Post-op Pain Management: Tylenol PO (pre-op)* and Minimal or no pain anticipated   Induction: Intravenous  PONV Risk Score and Plan: 3 and Ondansetron, Dexamethasone, Treatment may vary due to age or medical condition and Midazolam  Airway Management Planned: LMA  Additional Equipment:   Intra-op Plan:   Post-operative Plan: Extubation in OR  Informed Consent: I have reviewed the patients History and Physical, chart, labs and discussed the procedure including the risks, benefits and alternatives for the proposed anesthesia with the patient or authorized representative who has indicated his/her understanding and acceptance.     Dental advisory given  Plan Discussed with: CRNA and Surgeon  Anesthesia Plan Comments:         Anesthesia Quick Evaluation

## 2022-04-15 ENCOUNTER — Encounter (HOSPITAL_BASED_OUTPATIENT_CLINIC_OR_DEPARTMENT_OTHER): Payer: Self-pay | Admitting: Surgery

## 2022-04-16 LAB — SURGICAL PATHOLOGY

## 2022-04-17 ENCOUNTER — Encounter (HOSPITAL_COMMUNITY): Payer: Self-pay

## 2022-04-20 ENCOUNTER — Telehealth: Payer: Self-pay

## 2022-04-20 NOTE — Telephone Encounter (Signed)
Patient left VM requesting return call to acknowledge that her oncology team was aware she would like Dr. Isidore Moos to be her radiation oncologist (instead of receiving radiation in [place], [place]).   Returned patient's call and assured her that both Dr. Ninfa Linden and Dr. Benay Spice were aware. Informed her that once she was cleared by her surgeon to start radiation, she would be referred back to Dr. Isidore Moos for her radiation planning session. Patient verbalized understanding and appreciation of call. No other needs expressed, but patient knows to call back should she have any additional questions/concerns.

## 2022-04-26 ENCOUNTER — Ambulatory Visit: Payer: Commercial Managed Care - PPO | Admitting: Oncology

## 2022-04-26 ENCOUNTER — Encounter: Payer: Self-pay | Admitting: *Deleted

## 2022-04-26 ENCOUNTER — Inpatient Hospital Stay: Payer: Commercial Managed Care - PPO | Attending: Oncology | Admitting: Oncology

## 2022-04-26 ENCOUNTER — Other Ambulatory Visit (HOSPITAL_BASED_OUTPATIENT_CLINIC_OR_DEPARTMENT_OTHER): Payer: Self-pay

## 2022-04-26 VITALS — BP 150/87 | HR 65 | Temp 97.9°F | Resp 18 | Ht 67.0 in | Wt 128.2 lb

## 2022-04-26 DIAGNOSIS — Z87442 Personal history of urinary calculi: Secondary | ICD-10-CM

## 2022-04-26 DIAGNOSIS — D0512 Intraductal carcinoma in situ of left breast: Secondary | ICD-10-CM | POA: Diagnosis not present

## 2022-04-26 DIAGNOSIS — Z79899 Other long term (current) drug therapy: Secondary | ICD-10-CM | POA: Diagnosis not present

## 2022-04-26 DIAGNOSIS — H18833 Recurrent erosion of cornea, bilateral: Secondary | ICD-10-CM

## 2022-04-26 MED ORDER — FLUARIX QUADRIVALENT 0.5 ML IM SUSY
PREFILLED_SYRINGE | INTRAMUSCULAR | 0 refills | Status: DC
  Filled 2022-04-26: qty 0.5, 1d supply, fill #0

## 2022-04-26 NOTE — Progress Notes (Signed)
PATIENT NAVIGATOR PROGRESS NOTE  Name: Carlon Davidson Date: 04/26/2022 MRN: [id number]  DOB: [birth year]   Reason for visit:  New Patient Appt  Comments:  Met with Mr and Mrs Hedtke during visit with Dr Benay Spice She has post op appt with Dr Ninfa Linden on 11/1 Will send referral to Dr Isidore Moos regarding rad onc planning and schedule. Dr Benay Spice discussed starting Arimidex a few days after completing radiation treatments. Pt will call and inform our office when she begins the Arimidex Education given regarding daily Calcium and Vitamin D for bone health as well as managing vaginal dryness associated with stopping estrogen in September Given contact information to call with any issues or questions     Time spent counseling/coordinating care: > 60 minutes

## 2022-04-26 NOTE — Progress Notes (Signed)
[facility] New Patient Consult   Requesting MD: Rosine Door [address] [place],  [place] [address]   Jennifer Cantu 56 y.o.  [birth year]    Reason for Consult: DCIS   HPI: Right reports having a routine screening mammogram that revealed left breast microcalcifications (we do not have the screening mammogram report available today).  A left diagnostic mammogram 02/20/2022 revealed a loosely associated group of heterogenous microcalcifications over the middle third of the upper outer left breast spanning 1.6 cm. She underwent a stereotactic biopsy on 03/02/2022.  A tissue marker clip was placed in the biopsy cavity.  The pathology revealed DCIS, low-grade, cribriform, with focal necrosis and calcifications measuring 0.5 cm.  A breast prognostic profile found the ER at 95% and PR at 80%.  She was referred to Dr. Ninfa Linden was taken to a radioactive seed localized left lumpectomy on 04/12/2022.  The pathology revealed foci of intermediate to high-grade DCIS with calcifications.  The resection margins are negative.  No evidence of invasive carcinoma.  Fibrocystic change and biopsy site changes were noted.  The closest margin was the posterior margin at 9 mm.  She reports feeling well prior to the lumpectomy.  There was no palpable change in either breast.  She saw Dr. Isidore Moos and plans to complete a course of adjuvant left breast radiation.  Past Medical History:  Diagnosis Date   Cancer ([facility]) 03/02/2022   DCIS   Corneal erosion of both eyes    DJD (degenerative joint disease)    L 4 TO L5   High serum ferritin    History of kidney stones     .  G1P1, menarche at age 7, at age 25, menopause at age 46, Mirena device in place, on estrogen replacement from 2013 until September 2023  Past Surgical History:  Procedure Laterality Date   ABDOMINAL SURGERY     BREAST LUMPECTOMY WITH RADIOACTIVE SEED LOCALIZATION Left 04/12/2022   Procedure: LEFT BREAST LUMPECTOMY WITH  RADIOACTIVE SEED LOCALIZATION;  Surgeon: Coralie Keens, MD;  Location: [facility];  Service: General;  Laterality: Left;   CHOLECYSTECTOMY     LAP   CYSTOSCOPY WITH RETROGRADE PYELOGRAM, URETEROSCOPY AND STENT PLACEMENT Right 11/25/2018   Procedure: CYSTOSCOPY WITH RIGHT RETROGRADE URETEROSCOPY, STONE EXTRACTION;  Surgeon: Irine Seal, MD;  Location: [facility];  Service: Urology;  Laterality: Right;   WISDOM TOOTH EXTRACTION Bilateral    "when I was young"    Medications: Reviewed  Allergies: No Known Allergies  Family history: No family history of cancer  Social History:   She lives with her husband in [place].  She is retired Social worker.  She does not use cigarettes.  Rare alcohol use.  No transfusion history.  No risk factor for hepatitis.  ROS:   Positives include: Fatigue since discontinuing estrogen, hot flashes since stopping estrogen, burning with urination and dryness since discontinuing estrogen  A complete ROS was otherwise negative.  Physical Exam:  Blood pressure (!) 150/87, pulse 65, temperature 97.9 F (36.6 C), temperature source Oral, resp. rate 18, height $RemoveBe'5\' 7"'htHgXxcdd$  (1.702 m), weight 128 lb 3.2 oz (58.2 kg), SpO2 100 %.  HEENT: Oropharynx without visible mass, neck without mass Lungs: Clear bilaterally Cardiac: Regular rate and rhythm Abdomen: No hepatosplenomegaly, no mass, nontender  Vascular: No leg edema Lymph nodes: No cervical, supraclavicular, axillary, or inguinal nodes Neurologic: Alert and oriented, motor exam appears intact in the upper and lower extremities bilaterally Skin: Diffuse hyperpigmentation/tan  Musculoskeletal: No spine tenderness Breast: No mass in either breast, healing left areola incision   LAB:  CBC  Lab Results  Component Value Date   WBC 6.1 01/14/2019   HGB 12.9 01/14/2019   HCT 40.2 01/14/2019   MCV 93.5 01/14/2019   PLT 199 01/14/2019   NEUTROABS 4.1 01/14/2019        CMP  Lab  Results  Component Value Date   NA 138 11/24/2018   K 4.5 11/24/2018   CL 103 11/24/2018   CO2 28 11/24/2018   GLUCOSE 97 11/24/2018   BUN 13 11/24/2018   CREATININE 1.27 (H) 11/24/2018   CALCIUM 8.9 11/24/2018   PROT 7.1 11/02/2018   ALBUMIN 4.5 11/02/2018   AST 23 11/02/2018   ALT 16 11/02/2018   ALKPHOS 40 11/02/2018   BILITOT 0.4 11/02/2018   GFRNONAA 49 (L) 11/24/2018   GFRAA 57 (L) 11/24/2018      Assessment/Plan:   Left breast DCIS Diagnostic left mammogram 02/20/2022 ,1.6 cm group of indeterminate microcalcifications over the left upper outer quadrant Stereotactic core biopsy 03/02/2022, DCIS, low-grade, cribriform with focal necrosis, ER 95%, PR 80% Radioactive seed localized left lumpectomy 04/12/2022-foci of intermediate to high-grade DCIS with calcifications, negative resection margins, no evidence of invasive cancer, fibrocystic change, biopsy site change, closest margin posterior-9 mm Bilateral corneal erosion G1, P1, menarche 55, child 57, menopause in 2013 followed by estrogen replacement, Mirena in place, discontinued estrogen September 2023 Right kidney stone removed 2020   Disposition:   Jennifer Cantu has been diagnosed with left breast DCIS.  We discussed the prognosis and adjuvant treatment options.  She met with Dr. Isidore Moos and plans to complete a course of adjuvant left breast radiation.  We discussed the indication for adjuvant systemic therapy.  We discussed tamoxifen and aromatase inhibitor therapy.  She appears to be postmenopausal.  She understands the role of adjuvant hormonal therapy is to decrease the chance of recurrent DCIS, and a new breast cancer.  I explained there is no proven survival benefit with adjuvant hormonal therapy in this setting.  I recommend adjuvant anastrozole.  We reviewed potential toxicities associated with anastrozole including the chance of hot flashes, arthralgias, decreased bone density, and an altered cholesterol profile.  She  would like to proceed with a course of adjuvant anastrozole.  She will begin anastrozole within a few days of completing radiation.  Jennifer Cantu will return for an office visit in 3 months.  She has been referred to Dr. Sabra Heck for GYN care.  I think the Mirena device can be removed if she is postmenopausal.  She will discuss management of vaginal dryness with Dr. Sabra Heck.  [name], MD  04/26/2022, 4:26 PM

## 2022-04-27 ENCOUNTER — Encounter: Payer: Self-pay | Admitting: *Deleted

## 2022-04-27 ENCOUNTER — Telehealth: Payer: Self-pay | Admitting: Radiation Oncology

## 2022-04-27 NOTE — Telephone Encounter (Signed)
10/20 @ 9:09 am Left voicemail for patient to call our office to be schedule for consult with Dr. Isidore Moos.

## 2022-05-07 NOTE — Progress Notes (Signed)
Location of Breast Cancer: Left breast cancer  Histology per Pathology Report: 04-12-22  BREAST, LEFT, LUMPECTOMY:  - Foci of ductal carcinoma in situ, intermediate to high-grade, with  calcifications, See comment  - Resection margins are negative for DCIS  - No evidence of invasive carcinoma  - Fibrocystic change  - Biopsy site changes  - See oncology table   COMMENT:   Immunostains for CK5/6 and E-cadherin do not show definite evidence of  lobular carcinoma in situ.    ONCOLOGY TABLE:   Procedure: Lumpectomy  Specimen Laterality: Left  Histologic Type: Ductal carcinoma in situ  Size of DCIS: Largest contiguous focus measures 0.3 cm on HE glass  slide  Nuclear Grade: Intermediate to high-grade with calcifications  Necrosis: Not identified  Margins: All margins negative for DCIS       Specify Closest Margin (required only if <60m): Posterior margin  at 9 mm  Regional Lymph Nodes: Not applicable (no lymph nodes submitted or found)  Breast Biomarker Testing Performed on Previous Biopsy:       Testing performed on Case Number: [id number]       Estrogen Receptor: 95%, positive, Strong staining intensity       Progesterone Receptor: 80%, positive, Strong staining intensity  Pathologic Stage Classification (pTNM, AJCC 8th Edition): pTis, pN not  assigned  Representative Tumor Block: A5  Comment(s): None   (v4.4.0.0)   GROSS DESCRIPTION:   Specimen type: Received fresh is a left lumpectomy specimen with a radiographic seed that is identified and removed.  The specimen is placed in formalin at 9:04 AM on 04/12/2022.  Size: 6.4 x 4.9 x 2.6 cm (M-L x S-I x A-P)  Orientation: Anterior = green, posterior = black, superior = red,  inferior = blue, medial = yellow, and lateral = orange  Cut surface: The specimen is sectioned from medial to lateral and a Faxitron image is taken revealing a ribbon clip within an indistinct area of fibrous tissue.  Margins: The clip is located 1.2  cm from the anterior margin, 0.9 cm from the posterior margin, 1.7 cm from the superior margin, 1.1 cm from  the inferior margin, 2.1 cm from the medial margin, and 2.5 cm from the lateral margin. Block summary: A1 clip site  A2-A5 central fibrous tissue surrounding clip site, no true margin A6 anterior margin closest to clip site  A7 posterior margin closest to clip site  A8 Superior margin closest to clip site  A 9 inferior margin closest to clip site  A10 medial margin closest to clip site  A11 lateral margin closest to clip site  A12 distal uninvolved fibrous tissue  ([facility], 04/12/2022)    Receptor Status: ER(95 % positive), PR (80% postive), Her2-neu (na), Ki-67(na)  Did patient present with symptoms (if so, please note symptoms) or was this found on screening mammography?: screening mammograms  Past/Anticipated interventions by surgeon, if any: 04-12-22 Dr. BNinfa LindenLEFT BREAST LUMPECTOMY WITH RADIOACTIVE SEED LOCALIZATION  Past/Anticipated interventions by medical oncologist: We discussed the indication for adjuvant systemic therapy.  We discussed tamoxifen and aromatase inhibitor therapy.  She appears to be postmenopausal.  She understands the role of adjuvant hormonal therapy is to decrease the chance of recurrent DCIS, and a new breast cancer.  I explained there is no proven survival benefit with adjuvant hormonal therapy in this setting.  I recommend adjuvant anastrozole.  We reviewed potential toxicities associated with anastrozole including the chance of hot flashes, arthralgias, decreased bone density, and an altered cholesterol profile.  She would like to proceed with a course of adjuvant anastrozole.  She will begin anastrozole within a few days of completing radiation.   Lymphedema issues, if any:  none  Pain issues, if any:  no   SAFETY ISSUES: Prior radiation? no Pacemaker/ICD? no Possible current pregnancy? no Is the patient on methotrexate? no  Current Complaints / other  details:  Had lumpectomy with no lymph node removal.  Vitals:   05/15/22 1035  BP: 138/87  Pulse: 69  Resp: 18  Temp: 98.4 F (36.9 C)

## 2022-05-14 NOTE — Progress Notes (Signed)
Radiation Oncology         ([phone]) [phone] ________________________________  Name: Jennifer Cantu MRN: [id number]  Date: 05/15/2022  DOB: [birth year]  Follow-Up Visit Note  Outpatient  CC: Blenda Mounts, MD  Diagnosis:      ICD-10-CM   1. Ductal carcinoma in situ (DCIS) of left breast  D05.12 Pregnancy, urine     S/p lumpectomy: Stage 0 (cTis (DCIS), cN0, cM0) Left Breast, Intermediate to high-grade DCIS, ER+ / PR+ / Her2 not assessed; clean margins   CHIEF COMPLAINT: Here to discuss management of left breast DCIS  Narrative:  The patient returns today for follow-up.     Since consultation date of 04/03/22, the patient opted to proceed with left breast lumpectomy without nodal biopsies on 04/12/22 under the care of Dr. Ninfa Linden. Pathology from the procedure revealed: tumor size of approximately 0.3 cm; histology of focal intermediate to high-grade DCIS with calcifications (negative for IDC); all margins negative for in-situ carcinoma; margin status to in situ disease of 9 mm from the posterior margin; no lymph nodes were examined;  ER status: 95% positive and PR status 80% positive, both with strong staining intensity, Her2 not assessed.  The patient has met with Dr. Learta Codding and has agreed to proceed with adjuvant anastrozole following XRT.  During her most recent follow-up visit with Dr. Ninfa Linden on 05/11/22, the patient denied any concerns and was found to be meeting all post-operative milestones on examination.   Patient is here today with her husband. Symptomatically, the patient reports to be doing well. She is due for her one month appointment with surgery in a couple days. She has not been experiencing any pain or swelling since the surgery.          ALLERGIES:  has No Known Allergies.  Meds: Current Outpatient Medications  Medication Sig Dispense Refill   ALPRAZolam (XANAX) 0.5 MG tablet Take 0.5 mg by mouth at bedtime.      cholecalciferol  (VITAMIN D3) 25 MCG (1000 UNIT) tablet Take 1,000 Units by mouth daily. Takes 4,000 units daily     COLLAGEN-VITAMIN C PO Take by mouth.     levonorgestrel (MIRENA) 20 MCG/24HR IUD 1 each by Intrauterine route once. INSERTED 2018     linaclotide (LINZESS) 290 MCG CAPS capsule Take 290 mcg by mouth daily before breakfast.     Magnesium 100 MG CAPS Take by mouth.     Omega-3 Fatty Acids (FISH OIL) 1000 MG CAPS Take by mouth.     TYRVAYA 0.03 MG/ACT SOLN Place into both nostrils 2 (two) times daily.     influenza vac split quadrivalent PF (FLUARIX QUADRIVALENT) 0.5 ML injection Inject into the muscle. (Patient not taking: Reported on 05/15/2022) 0.5 mL 0   No current facility-administered medications for this encounter.    Physical Findings:  height is _0  (1.702 m) and weight is 130 lb 8 oz (59.2 kg). Her oral temperature is 98.4 F (36.9 C). Her blood pressure is 138/87 and her pulse is 69. Her respiration is 18. .     General: Alert and oriented, in no acute distress HEENT: Head is normocephalic. Extraocular movements are intact.   Neck: Neck is supple, no palpable cervical or supraclavicular lymphadenopathy. Heart: Regular in rate and rhythm with no murmurs, rubs, or gallops. Chest: Clear to auscultation bilaterally, with no rhonchi, wheezes, or rales. Extremities: No cyanosis or edema. Lymphatics: see Neck Exam Musculoskeletal: symmetric strength and muscle tone throughout. Neurologic: No obvious  focalities. Speech is fluent.  Psychiatric: Judgment and insight are intact. Affect is appropriate. Breast exam reveals a well healed incision site in the left periareolar lateral breast. No tenderness to palpation. No other masses in the axilla or breast were palpated.   Lab Findings: Lab Results  Component Value Date   WBC 6.1 01/14/2019   HGB 12.9 01/14/2019   HCT 40.2 01/14/2019   MCV 93.5 01/14/2019   PLT 199 01/14/2019    Radiographic Findings: No results  found.  Impression/Plan: Stage 0 (cTis (DCIS), cN0, cM0), Left Breast  We discussed adjuvant radiotherapy today.  I recommend 4 weeks radiation therapy in order to decrease the risk of local recurrence by half.  I reviewed the logistics, benefits, risks, and potential side effects of this treatment in detail. Risks may include but not necessary be limited to acute and late injury tissue in the radiation fields such as skin irritation (change in color/pigmentation, itching, dryness, pain, peeling). She may experience fatigue. We also discussed possible risk of long term cosmetic changes or scar tissue. There is also a smaller risk for lung toxicity, cardiac toxicity, brachial plexopathy, lymphedema, musculoskeletal changes, rib fragility or induction of a second malignancy, late chronic non-healing soft tissue wound.    The patient and her husband asked good questions which I answered to their satisfaction. She is enthusiastic about proceeding with treatment. A consent form has been  signed and placed in her chart.  The patient will receive 40.05 Gy in 15 fractions to the left breast.  This will be followed by a boost of 10 Gy in 5 fractions to the lumpectomy cavity.  On date of service, in total, I spent 40 minutes on this encounter. Patient was seen in person.  _____________________________________   Leona Singleton, PA   Eppie Gibson, MD  This document serves as a record of services personally performed by Eppie Gibson, MD. It was created on her behalf by Roney Mans, a trained medical scribe. The creation of this record is based on the scribe's personal observations and the provider's statements to them. This document has been checked and approved by the attending provider.

## 2022-05-15 ENCOUNTER — Encounter: Payer: Self-pay | Admitting: *Deleted

## 2022-05-15 ENCOUNTER — Encounter: Payer: Self-pay | Admitting: Radiation Oncology

## 2022-05-15 ENCOUNTER — Other Ambulatory Visit: Payer: Self-pay

## 2022-05-15 ENCOUNTER — Ambulatory Visit: Payer: Commercial Managed Care - PPO

## 2022-05-15 ENCOUNTER — Ambulatory Visit
Admission: RE | Admit: 2022-05-15 | Discharge: 2022-05-15 | Disposition: A | Discharge status: Home / Self Care | Payer: Commercial Managed Care - PPO | Source: Ambulatory Visit | Attending: Radiation Oncology | Admitting: Radiation Oncology

## 2022-05-15 VITALS — BP 138/87 | HR 69 | Temp 98.4°F | Resp 18 | Ht 67.0 in | Wt 130.5 lb

## 2022-05-15 DIAGNOSIS — D0512 Intraductal carcinoma in situ of left breast: Secondary | ICD-10-CM | POA: Diagnosis present

## 2022-05-15 DIAGNOSIS — Z79899 Other long term (current) drug therapy: Secondary | ICD-10-CM | POA: Insufficient documentation

## 2022-05-15 DIAGNOSIS — Z170 Estrogen receptor positive status [ER+]: Secondary | ICD-10-CM | POA: Insufficient documentation

## 2022-05-15 DIAGNOSIS — Z510 Encounter for antineoplastic radiation therapy: Secondary | ICD-10-CM | POA: Insufficient documentation

## 2022-05-15 LAB — PREGNANCY, URINE: Preg Test, Ur: NEGATIVE

## 2022-05-16 DIAGNOSIS — Z510 Encounter for antineoplastic radiation therapy: Secondary | ICD-10-CM | POA: Diagnosis not present

## 2022-05-21 ENCOUNTER — Ambulatory Visit: Payer: Commercial Managed Care - PPO

## 2022-05-21 ENCOUNTER — Ambulatory Visit
Admission: RE | Admit: 2022-05-21 | Discharge: 2022-05-21 | Disposition: A | Discharge status: Home / Self Care | Payer: Commercial Managed Care - PPO | Source: Ambulatory Visit | Attending: Radiation Oncology | Admitting: Radiation Oncology

## 2022-05-21 ENCOUNTER — Other Ambulatory Visit: Payer: Self-pay

## 2022-05-21 DIAGNOSIS — D0512 Intraductal carcinoma in situ of left breast: Secondary | ICD-10-CM

## 2022-05-21 DIAGNOSIS — Z510 Encounter for antineoplastic radiation therapy: Secondary | ICD-10-CM | POA: Diagnosis not present

## 2022-05-21 LAB — RAD ONC ARIA SESSION SUMMARY
Course Elapsed Days: 0
Plan Fractions Treated to Date: 1
Plan Prescribed Dose Per Fraction: 2.67 Gy
Plan Total Fractions Prescribed: 15
Plan Total Prescribed Dose: 40.05 Gy
Reference Point Dosage Given to Date: 2.67 Gy
Reference Point Session Dosage Given: 2.67 Gy
Session Number: 1

## 2022-05-21 MED ORDER — ALRA NON-METALLIC DEODORANT (RAD-ONC)
1.0000 | Freq: Once | TOPICAL | Status: AC
  Administered 2022-05-21: 1 via TOPICAL

## 2022-05-21 MED ORDER — RADIAPLEXRX EX GEL: Freq: Once | CUTANEOUS | Status: AC

## 2022-05-22 ENCOUNTER — Other Ambulatory Visit: Payer: Self-pay

## 2022-05-22 ENCOUNTER — Ambulatory Visit
Admission: RE | Admit: 2022-05-22 | Discharge: 2022-05-22 | Disposition: A | Discharge status: Home / Self Care | Payer: Commercial Managed Care - PPO | Source: Ambulatory Visit | Attending: Radiation Oncology | Admitting: Radiation Oncology

## 2022-05-22 DIAGNOSIS — Z510 Encounter for antineoplastic radiation therapy: Secondary | ICD-10-CM | POA: Diagnosis not present

## 2022-05-22 LAB — RAD ONC ARIA SESSION SUMMARY
Course Elapsed Days: 1
Plan Fractions Treated to Date: 2
Plan Prescribed Dose Per Fraction: 2.67 Gy
Plan Total Fractions Prescribed: 15
Plan Total Prescribed Dose: 40.05 Gy
Reference Point Dosage Given to Date: 5.34 Gy
Reference Point Session Dosage Given: 2.67 Gy
Session Number: 2

## 2022-05-23 ENCOUNTER — Ambulatory Visit
Admission: RE | Admit: 2022-05-23 | Discharge: 2022-05-23 | Disposition: A | Discharge status: Home / Self Care | Payer: Commercial Managed Care - PPO | Source: Ambulatory Visit | Attending: Radiation Oncology | Admitting: Radiation Oncology

## 2022-05-23 ENCOUNTER — Other Ambulatory Visit: Payer: Self-pay

## 2022-05-23 ENCOUNTER — Ambulatory Visit: Payer: Commercial Managed Care - PPO

## 2022-05-23 DIAGNOSIS — Z510 Encounter for antineoplastic radiation therapy: Secondary | ICD-10-CM | POA: Diagnosis not present

## 2022-05-23 LAB — RAD ONC ARIA SESSION SUMMARY
Course Elapsed Days: 2
Plan Fractions Treated to Date: 3
Plan Prescribed Dose Per Fraction: 2.67 Gy
Plan Total Fractions Prescribed: 15
Plan Total Prescribed Dose: 40.05 Gy
Reference Point Dosage Given to Date: 8.01 Gy
Reference Point Session Dosage Given: 2.67 Gy
Session Number: 3

## 2022-05-24 ENCOUNTER — Other Ambulatory Visit: Payer: Self-pay

## 2022-05-24 ENCOUNTER — Ambulatory Visit
Admission: RE | Admit: 2022-05-24 | Discharge: 2022-05-24 | Disposition: A | Discharge status: Home / Self Care | Payer: Commercial Managed Care - PPO | Source: Ambulatory Visit | Attending: Radiation Oncology | Admitting: Radiation Oncology

## 2022-05-24 DIAGNOSIS — Z510 Encounter for antineoplastic radiation therapy: Secondary | ICD-10-CM | POA: Diagnosis not present

## 2022-05-24 LAB — RAD ONC ARIA SESSION SUMMARY
Course Elapsed Days: 3
Plan Fractions Treated to Date: 4
Plan Prescribed Dose Per Fraction: 2.67 Gy
Plan Total Fractions Prescribed: 15
Plan Total Prescribed Dose: 40.05 Gy
Reference Point Dosage Given to Date: 10.68 Gy
Reference Point Session Dosage Given: 2.67 Gy
Session Number: 4

## 2022-05-25 ENCOUNTER — Ambulatory Visit
Admission: RE | Admit: 2022-05-25 | Discharge: 2022-05-25 | Disposition: A | Discharge status: Home / Self Care | Payer: Commercial Managed Care - PPO | Source: Ambulatory Visit | Attending: Radiation Oncology | Admitting: Radiation Oncology

## 2022-05-25 ENCOUNTER — Other Ambulatory Visit: Payer: Self-pay

## 2022-05-25 DIAGNOSIS — Z510 Encounter for antineoplastic radiation therapy: Secondary | ICD-10-CM | POA: Diagnosis not present

## 2022-05-25 LAB — RAD ONC ARIA SESSION SUMMARY
Course Elapsed Days: 4
Plan Fractions Treated to Date: 5
Plan Prescribed Dose Per Fraction: 2.67 Gy
Plan Total Fractions Prescribed: 15
Plan Total Prescribed Dose: 40.05 Gy
Reference Point Dosage Given to Date: 13.35 Gy
Reference Point Session Dosage Given: 2.67 Gy
Session Number: 5

## 2022-05-27 ENCOUNTER — Ambulatory Visit
Admission: RE | Admit: 2022-05-27 | Discharge: 2022-05-27 | Disposition: A | Discharge status: Home / Self Care | Payer: Commercial Managed Care - PPO | Source: Ambulatory Visit | Attending: Radiation Oncology | Admitting: Radiation Oncology

## 2022-05-27 ENCOUNTER — Other Ambulatory Visit: Payer: Self-pay

## 2022-05-27 DIAGNOSIS — Z510 Encounter for antineoplastic radiation therapy: Secondary | ICD-10-CM | POA: Diagnosis not present

## 2022-05-27 LAB — RAD ONC ARIA SESSION SUMMARY
Course Elapsed Days: 6
Plan Fractions Treated to Date: 6
Plan Prescribed Dose Per Fraction: 2.67 Gy
Plan Total Fractions Prescribed: 15
Plan Total Prescribed Dose: 40.05 Gy
Reference Point Dosage Given to Date: 16.02 Gy
Reference Point Session Dosage Given: 2.67 Gy
Session Number: 6

## 2022-05-28 ENCOUNTER — Other Ambulatory Visit: Payer: Self-pay

## 2022-05-28 ENCOUNTER — Ambulatory Visit
Admission: RE | Admit: 2022-05-28 | Discharge: 2022-05-28 | Disposition: A | Discharge status: Home / Self Care | Payer: Commercial Managed Care - PPO | Source: Ambulatory Visit | Attending: Radiation Oncology | Admitting: Radiation Oncology

## 2022-05-28 ENCOUNTER — Ambulatory Visit: Payer: Commercial Managed Care - PPO

## 2022-05-28 DIAGNOSIS — Z510 Encounter for antineoplastic radiation therapy: Secondary | ICD-10-CM | POA: Diagnosis not present

## 2022-05-28 LAB — RAD ONC ARIA SESSION SUMMARY
Course Elapsed Days: 7
Plan Fractions Treated to Date: 7
Plan Prescribed Dose Per Fraction: 2.67 Gy
Plan Total Fractions Prescribed: 15
Plan Total Prescribed Dose: 40.05 Gy
Reference Point Dosage Given to Date: 18.69 Gy
Reference Point Session Dosage Given: 2.67 Gy
Session Number: 7

## 2022-05-29 ENCOUNTER — Ambulatory Visit: Payer: Commercial Managed Care - PPO

## 2022-05-29 ENCOUNTER — Ambulatory Visit
Admission: RE | Admit: 2022-05-29 | Discharge: 2022-05-29 | Disposition: A | Discharge status: Home / Self Care | Payer: Commercial Managed Care - PPO | Source: Ambulatory Visit | Attending: Radiation Oncology | Admitting: Radiation Oncology

## 2022-05-29 ENCOUNTER — Other Ambulatory Visit: Payer: Self-pay

## 2022-05-29 DIAGNOSIS — Z510 Encounter for antineoplastic radiation therapy: Secondary | ICD-10-CM | POA: Diagnosis not present

## 2022-05-29 LAB — RAD ONC ARIA SESSION SUMMARY
Course Elapsed Days: 8
Plan Fractions Treated to Date: 8
Plan Prescribed Dose Per Fraction: 2.67 Gy
Plan Total Fractions Prescribed: 15
Plan Total Prescribed Dose: 40.05 Gy
Reference Point Dosage Given to Date: 21.36 Gy
Reference Point Session Dosage Given: 2.67 Gy
Session Number: 8

## 2022-05-30 ENCOUNTER — Other Ambulatory Visit: Payer: Self-pay

## 2022-05-30 ENCOUNTER — Ambulatory Visit
Admission: RE | Admit: 2022-05-30 | Discharge: 2022-05-30 | Disposition: A | Discharge status: Home / Self Care | Payer: Commercial Managed Care - PPO | Source: Ambulatory Visit | Attending: Radiation Oncology | Admitting: Radiation Oncology

## 2022-05-30 ENCOUNTER — Ambulatory Visit: Payer: Commercial Managed Care - PPO

## 2022-05-30 DIAGNOSIS — Z510 Encounter for antineoplastic radiation therapy: Secondary | ICD-10-CM | POA: Diagnosis not present

## 2022-05-30 LAB — RAD ONC ARIA SESSION SUMMARY
Course Elapsed Days: 9
Plan Fractions Treated to Date: 9
Plan Prescribed Dose Per Fraction: 2.67 Gy
Plan Total Fractions Prescribed: 15
Plan Total Prescribed Dose: 40.05 Gy
Reference Point Dosage Given to Date: 24.03 Gy
Reference Point Session Dosage Given: 2.67 Gy
Session Number: 9

## 2022-06-04 ENCOUNTER — Ambulatory Visit
Admission: RE | Admit: 2022-06-04 | Discharge: 2022-06-04 | Disposition: A | Discharge status: Home / Self Care | Payer: Commercial Managed Care - PPO | Source: Ambulatory Visit | Attending: Radiation Oncology | Admitting: Radiation Oncology

## 2022-06-04 ENCOUNTER — Ambulatory Visit: Payer: Commercial Managed Care - PPO

## 2022-06-04 ENCOUNTER — Other Ambulatory Visit: Payer: Self-pay

## 2022-06-04 DIAGNOSIS — Z510 Encounter for antineoplastic radiation therapy: Secondary | ICD-10-CM | POA: Diagnosis not present

## 2022-06-04 LAB — RAD ONC ARIA SESSION SUMMARY
Course Elapsed Days: 14
Plan Fractions Treated to Date: 10
Plan Prescribed Dose Per Fraction: 2.67 Gy
Plan Total Fractions Prescribed: 15
Plan Total Prescribed Dose: 40.05 Gy
Reference Point Dosage Given to Date: 26.7 Gy
Reference Point Session Dosage Given: 2.67 Gy
Session Number: 10

## 2022-06-05 ENCOUNTER — Ambulatory Visit
Admission: RE | Admit: 2022-06-05 | Discharge: 2022-06-05 | Disposition: A | Discharge status: Home / Self Care | Payer: Commercial Managed Care - PPO | Source: Ambulatory Visit | Attending: Radiation Oncology | Admitting: Radiation Oncology

## 2022-06-05 ENCOUNTER — Other Ambulatory Visit: Payer: Self-pay

## 2022-06-05 ENCOUNTER — Ambulatory Visit: Payer: Commercial Managed Care - PPO

## 2022-06-05 DIAGNOSIS — Z510 Encounter for antineoplastic radiation therapy: Secondary | ICD-10-CM | POA: Diagnosis not present

## 2022-06-05 LAB — RAD ONC ARIA SESSION SUMMARY
Course Elapsed Days: 15
Plan Fractions Treated to Date: 11
Plan Prescribed Dose Per Fraction: 2.67 Gy
Plan Total Fractions Prescribed: 15
Plan Total Prescribed Dose: 40.05 Gy
Reference Point Dosage Given to Date: 29.37 Gy
Reference Point Session Dosage Given: 2.67 Gy
Session Number: 11

## 2022-06-06 ENCOUNTER — Ambulatory Visit
Admission: RE | Admit: 2022-06-06 | Discharge: 2022-06-06 | Disposition: A | Discharge status: Home / Self Care | Payer: Commercial Managed Care - PPO | Source: Ambulatory Visit | Attending: Radiation Oncology | Admitting: Radiation Oncology

## 2022-06-06 ENCOUNTER — Other Ambulatory Visit: Payer: Self-pay

## 2022-06-06 ENCOUNTER — Ambulatory Visit: Payer: Commercial Managed Care - PPO

## 2022-06-06 DIAGNOSIS — Z510 Encounter for antineoplastic radiation therapy: Secondary | ICD-10-CM | POA: Diagnosis not present

## 2022-06-06 LAB — RAD ONC ARIA SESSION SUMMARY
Course Elapsed Days: 16
Plan Fractions Treated to Date: 12
Plan Prescribed Dose Per Fraction: 2.67 Gy
Plan Total Fractions Prescribed: 15
Plan Total Prescribed Dose: 40.05 Gy
Reference Point Dosage Given to Date: 32.04 Gy
Reference Point Session Dosage Given: 2.67 Gy
Session Number: 12

## 2022-06-07 ENCOUNTER — Ambulatory Visit: Payer: Commercial Managed Care - PPO

## 2022-06-07 ENCOUNTER — Ambulatory Visit
Admission: RE | Admit: 2022-06-07 | Discharge: 2022-06-07 | Disposition: A | Discharge status: Home / Self Care | Payer: Commercial Managed Care - PPO | Source: Ambulatory Visit | Attending: Radiation Oncology | Admitting: Radiation Oncology

## 2022-06-07 ENCOUNTER — Other Ambulatory Visit: Payer: Self-pay

## 2022-06-07 DIAGNOSIS — Z510 Encounter for antineoplastic radiation therapy: Secondary | ICD-10-CM | POA: Diagnosis not present

## 2022-06-07 LAB — RAD ONC ARIA SESSION SUMMARY
Course Elapsed Days: 17
Plan Fractions Treated to Date: 13
Plan Prescribed Dose Per Fraction: 2.67 Gy
Plan Total Fractions Prescribed: 15
Plan Total Prescribed Dose: 40.05 Gy
Reference Point Dosage Given to Date: 34.71 Gy
Reference Point Session Dosage Given: 2.67 Gy
Session Number: 13

## 2022-06-08 ENCOUNTER — Other Ambulatory Visit: Payer: Self-pay

## 2022-06-08 ENCOUNTER — Ambulatory Visit: Payer: Commercial Managed Care - PPO

## 2022-06-08 ENCOUNTER — Ambulatory Visit
Admission: RE | Admit: 2022-06-08 | Discharge: 2022-06-08 | Disposition: A | Discharge status: Home / Self Care | Payer: Commercial Managed Care - PPO | Source: Ambulatory Visit | Attending: Radiation Oncology | Admitting: Radiation Oncology

## 2022-06-08 DIAGNOSIS — D0512 Intraductal carcinoma in situ of left breast: Secondary | ICD-10-CM | POA: Diagnosis present

## 2022-06-08 LAB — RAD ONC ARIA SESSION SUMMARY
Course Elapsed Days: 18
Plan Fractions Treated to Date: 14
Plan Prescribed Dose Per Fraction: 2.67 Gy
Plan Total Fractions Prescribed: 15
Plan Total Prescribed Dose: 40.05 Gy
Reference Point Dosage Given to Date: 37.38 Gy
Reference Point Session Dosage Given: 2.67 Gy
Session Number: 14

## 2022-06-11 ENCOUNTER — Ambulatory Visit: Payer: Commercial Managed Care - PPO | Admitting: Radiation Oncology

## 2022-06-11 ENCOUNTER — Ambulatory Visit: Payer: Commercial Managed Care - PPO

## 2022-06-11 ENCOUNTER — Other Ambulatory Visit: Payer: Self-pay

## 2022-06-11 ENCOUNTER — Ambulatory Visit
Admission: RE | Admit: 2022-06-11 | Discharge: 2022-06-11 | Disposition: A | Discharge status: Home / Self Care | Payer: Commercial Managed Care - PPO | Source: Ambulatory Visit | Attending: Radiation Oncology | Admitting: Radiation Oncology

## 2022-06-11 DIAGNOSIS — D0512 Intraductal carcinoma in situ of left breast: Secondary | ICD-10-CM | POA: Diagnosis not present

## 2022-06-11 LAB — RAD ONC ARIA SESSION SUMMARY
Course Elapsed Days: 21
Plan Fractions Treated to Date: 15
Plan Prescribed Dose Per Fraction: 2.67 Gy
Plan Total Fractions Prescribed: 15
Plan Total Prescribed Dose: 40.05 Gy
Reference Point Dosage Given to Date: 40.05 Gy
Reference Point Session Dosage Given: 2.67 Gy
Session Number: 15

## 2022-06-12 ENCOUNTER — Ambulatory Visit
Admission: RE | Admit: 2022-06-12 | Discharge: 2022-06-12 | Disposition: A | Discharge status: Home / Self Care | Payer: Commercial Managed Care - PPO | Source: Ambulatory Visit | Attending: Radiation Oncology | Admitting: Radiation Oncology

## 2022-06-12 ENCOUNTER — Other Ambulatory Visit: Payer: Self-pay

## 2022-06-12 ENCOUNTER — Ambulatory Visit: Payer: Commercial Managed Care - PPO

## 2022-06-12 DIAGNOSIS — D0512 Intraductal carcinoma in situ of left breast: Secondary | ICD-10-CM | POA: Diagnosis not present

## 2022-06-12 LAB — RAD ONC ARIA SESSION SUMMARY
Course Elapsed Days: 22
Plan Fractions Treated to Date: 1
Plan Prescribed Dose Per Fraction: 2 Gy
Plan Total Fractions Prescribed: 5
Plan Total Prescribed Dose: 10 Gy
Reference Point Dosage Given to Date: 2 Gy
Reference Point Session Dosage Given: 2 Gy
Session Number: 16

## 2022-06-13 ENCOUNTER — Ambulatory Visit: Payer: Commercial Managed Care - PPO | Admitting: Radiation Oncology

## 2022-06-13 ENCOUNTER — Ambulatory Visit
Admission: RE | Admit: 2022-06-13 | Discharge: 2022-06-13 | Disposition: A | Discharge status: Home / Self Care | Payer: Commercial Managed Care - PPO | Source: Ambulatory Visit | Attending: Radiation Oncology | Admitting: Radiation Oncology

## 2022-06-13 ENCOUNTER — Other Ambulatory Visit: Payer: Self-pay

## 2022-06-13 DIAGNOSIS — D0512 Intraductal carcinoma in situ of left breast: Secondary | ICD-10-CM | POA: Diagnosis not present

## 2022-06-13 LAB — RAD ONC ARIA SESSION SUMMARY
Course Elapsed Days: 23
Plan Fractions Treated to Date: 2
Plan Prescribed Dose Per Fraction: 2 Gy
Plan Total Fractions Prescribed: 5
Plan Total Prescribed Dose: 10 Gy
Reference Point Dosage Given to Date: 4 Gy
Reference Point Session Dosage Given: 2 Gy
Session Number: 17

## 2022-06-14 ENCOUNTER — Other Ambulatory Visit: Payer: Self-pay

## 2022-06-14 ENCOUNTER — Ambulatory Visit
Admission: RE | Admit: 2022-06-14 | Discharge: 2022-06-14 | Disposition: A | Discharge status: Home / Self Care | Payer: Commercial Managed Care - PPO | Source: Ambulatory Visit | Attending: Radiation Oncology | Admitting: Radiation Oncology

## 2022-06-14 ENCOUNTER — Ambulatory Visit: Payer: Commercial Managed Care - PPO

## 2022-06-14 DIAGNOSIS — D0512 Intraductal carcinoma in situ of left breast: Secondary | ICD-10-CM | POA: Diagnosis not present

## 2022-06-14 LAB — RAD ONC ARIA SESSION SUMMARY
Course Elapsed Days: 24
Plan Fractions Treated to Date: 3
Plan Prescribed Dose Per Fraction: 2 Gy
Plan Total Fractions Prescribed: 5
Plan Total Prescribed Dose: 10 Gy
Reference Point Dosage Given to Date: 6 Gy
Reference Point Session Dosage Given: 2 Gy
Session Number: 18

## 2022-06-15 ENCOUNTER — Other Ambulatory Visit: Payer: Self-pay

## 2022-06-15 ENCOUNTER — Telehealth: Payer: Self-pay | Admitting: *Deleted

## 2022-06-15 ENCOUNTER — Ambulatory Visit: Payer: Commercial Managed Care - PPO

## 2022-06-15 ENCOUNTER — Ambulatory Visit
Admission: RE | Admit: 2022-06-15 | Discharge: 2022-06-15 | Disposition: A | Discharge status: Home / Self Care | Payer: Commercial Managed Care - PPO | Source: Ambulatory Visit | Attending: Radiation Oncology | Admitting: Radiation Oncology

## 2022-06-15 DIAGNOSIS — D0512 Intraductal carcinoma in situ of left breast: Secondary | ICD-10-CM | POA: Diagnosis not present

## 2022-06-15 LAB — RAD ONC ARIA SESSION SUMMARY
Course Elapsed Days: 25
Plan Fractions Treated to Date: 4
Plan Prescribed Dose Per Fraction: 2 Gy
Plan Total Fractions Prescribed: 5
Plan Total Prescribed Dose: 10 Gy
Reference Point Dosage Given to Date: 8 Gy
Reference Point Session Dosage Given: 2 Gy
Session Number: 19

## 2022-06-18 ENCOUNTER — Ambulatory Visit
Admission: RE | Admit: 2022-06-18 | Discharge: 2022-06-18 | Disposition: A | Discharge status: Home / Self Care | Payer: Commercial Managed Care - PPO | Source: Ambulatory Visit | Attending: Radiation Oncology | Admitting: Radiation Oncology

## 2022-06-18 ENCOUNTER — Ambulatory Visit: Payer: Commercial Managed Care - PPO

## 2022-06-18 ENCOUNTER — Other Ambulatory Visit: Payer: Self-pay

## 2022-06-18 DIAGNOSIS — D0512 Intraductal carcinoma in situ of left breast: Secondary | ICD-10-CM | POA: Diagnosis not present

## 2022-06-18 LAB — RAD ONC ARIA SESSION SUMMARY
Course Elapsed Days: 28
Plan Fractions Treated to Date: 5
Plan Prescribed Dose Per Fraction: 2 Gy
Plan Total Fractions Prescribed: 5
Plan Total Prescribed Dose: 10 Gy
Reference Point Dosage Given to Date: 10 Gy
Reference Point Session Dosage Given: 2 Gy
Session Number: 20

## 2022-06-18 MED ORDER — RADIAPLEXRX EX GEL: Freq: Once | CUTANEOUS | Status: AC

## 2022-06-19 ENCOUNTER — Encounter: Payer: Self-pay | Admitting: *Deleted

## 2022-06-19 ENCOUNTER — Other Ambulatory Visit: Payer: Self-pay | Admitting: *Deleted

## 2022-06-19 ENCOUNTER — Ambulatory Visit: Payer: Commercial Managed Care - PPO

## 2022-06-19 MED ORDER — ANASTROZOLE 1 MG PO TABS: 1.0000 mg | ORAL_TABLET | Freq: Every day | ORAL | 11 refills | Status: DC

## 2022-06-19 NOTE — Progress Notes (Signed)
PATIENT NAVIGATOR PROGRESS NOTE  Name: Jennifer Cantu Date: 06/19/2022 MRN: [id number]  DOB: [birth year]   Reason for visit:  Starting Anastrozole after radiation  Comments:  Left message after discussion with Dr Benay Spice he recommends starting the Anastrozole on 12/26(two weeks after radiation completed)  Anastrozole escribed to pharmacy of record  Encouraged patient to call back with any questions or issues    Time spent counseling/coordinating care: 30-45 minutes

## 2022-07-04 ENCOUNTER — Encounter: Payer: Self-pay | Admitting: *Deleted

## 2022-07-13 NOTE — Radiation Completion Notes (Signed)
Patient Name: Jennifer Cantu, Jennifer Cantu MRN: [id number] Date of Birth: April 15, 1966 Referring Physician: Tawni Carnes, M.D. Date of Service: 2022-07-13 Radiation Oncologist: Eppie Gibson, M.D. 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 Intent: Curative     ==========DELIVERED PLANS==========  First Treatment Date: 2022-05-21 - Last Treatment Date: 2022-06-18   Plan Name: Breast_L_BH Site: Breast, Left Technique: 3D Mode: Photon Dose Per Fraction: 2.67 Gy Prescribed Dose (Delivered / Prescribed): 40.05 Gy / 40.05 Gy Prescribed Fxs (Delivered / Prescribed): 15 / 15   Plan Name: Brst_L_Bst_BH Site: Breast, Left Technique: 3D Mode: Photon Dose Per Fraction: 2 Gy Prescribed Dose (Delivered / Prescribed): 10 Gy / 10 Gy Prescribed Fxs (Delivered / Prescribed): 5 / 5     ==========ON TREATMENT VISIT DATES========== 2022-05-21, 2022-05-28, 2022-06-04, 2022-06-11, 2022-06-18     ==========UPCOMING VISITS==========       ==========APPENDIX - ON TREATMENT VISIT NOTES==========   PatEd 2022-05-21 Completed 05/21/22-KES; Ongoing education performed.   ImpPlan 2022-05-21 The patient is tolerating radiation. Continue treatment as planned.   PhysExam 2022-05-21 Alert, no acute distress.   RunningNotes 2022-05-21 Education booklet, radiaplex, and alra provided 05/21/22-KES   ProgNote 2022-05-21 Changes from last week/visit? [ No ] Pain? [ No ] Fatigue? [ No ] Skin irritation? [ No ] Using lotions? [ No; provided today with education ] Lymphedema? [ No ] Issues with ROM? [ No ] Need refills: [ No ] Additional  Weekly Progress Notes [ Tolerated first treatment well. Education reviewed, and all questions/concerns at the time addressed ]    PatEd 2022-05-28 Ongoing education performed.   ImpPlan 2022-05-28 The patient is tolerating radiation.  Continue treatment as planned.   PhysExam 2022-05-28 Alert, no acute distress.   ProgNote 2022-05-28 Changes from last week/visit? [ No ] Pain? [ No ] Fatigue? [ Yes, mild ] Skin irritation? [ No ] Using lotions? [ Yes ] Lymphedema? [ No ] Issues with ROM? [ No ] Need refills: [ No ] Additional  Weekly Progress Notes [  ]    PatEd 2022-06-04 Ongoing education performed.   ImpPlan 2022-06-04 The patient is tolerating radiation. Continue treatment as planned.   PhysExam 2022-06-04 Alert, no acute distress.   ProgNote 2022-06-04 Changes from last week/visit? [ No ] Pain? [ No ] Fatigue? [ more fatigue this week ] Skin irritation? [ No ] Using lotions? [ Yes ] Lymphedema? [ No ] Issues with ROM? [ No ] Need refills: [ No ] Additional  Weekly Progress Notes [ no major changes this week ]    PatEd 2022-06-11 Ongoing education performed.   ImpPlan 2022-06-11 The patient is tolerating radiation. Continue treatment as planned.   PhysExam 2022-06-11 Alert, no acute distress.   ProgNote 2022-06-11 Changes from last week/visit? [ No ] Pain? [ No ] Fatigue? [ Yes, mild fatigue ] Skin irritation? [ Yes- redness under arm ] Using lotions? [ Yes ] Lymphedema? [ No ] Issues with ROM? [ No ] Need refills: [ No ] Additional  Weekly Progress Notes [ no major concerns  ]    PatEd 2022-06-18 Ongoing education performed.   ImpPlan 2022-06-18 The patient is tolerating radiation. Continue treatment as planned.   PhysExam 2022-06-18 Alert, no acute distress.   ProgNote 2022-06-18 Changes from  last week/visit? [ no ] Pain? [ Yes, tenderness at nipple area ] Fatigue? [ mild fatigue ] Skin irritation? [ Yes, lotion helping ] Using lotions? [ Yes ] Lymphedema? [ No ] Issues with ROM? [ No ] Need refills: [ No ] Additional  Weekly Progress Notes [ doing well at this time ]

## 2022-07-19 ENCOUNTER — Encounter: Payer: Self-pay | Admitting: Radiation Oncology

## 2022-07-19 ENCOUNTER — Ambulatory Visit
Admission: RE | Admit: 2022-07-19 | Discharge: 2022-07-19 | Disposition: A | Discharge status: Home / Self Care | Payer: Commercial Managed Care - PPO | Source: Ambulatory Visit | Attending: Radiation Oncology | Admitting: Radiation Oncology

## 2022-07-19 ENCOUNTER — Telehealth: Payer: Self-pay

## 2022-07-19 NOTE — Telephone Encounter (Signed)
I called the patient today about her upcoming follow-up appointment in radiation oncology.   Given the state of the COVID-19 pandemic, concerning case numbers in our community, and guidance from [facility], I offered a phone assessment with the patient to determine if coming to the clinic was necessary. She accepted.  The patient denies any symptomatic concerns.  Specifically, they report good healing of their skin in the radiation fields.  Skin is intact.  She did report some discoloration at radiation site but nothing of concern. Pt also stated in phone conversation that her fatigue remains but she is going to join a gym soon to see if that will help with this concern. One area of concern was some back pain that has developed. She sees Dr. Richmond Campbell on 08-01-22 to address this concern.   I recommended that she continue skin care by applying oil or lotion with vitamin E to the skin in the radiation fields, BID, for 2 more months.  Continue follow-up with medical oncology - follow-up is scheduled on 08-02-22 with Dr. Benay Spice.  I explained that yearly mammograms are important for patients with intact breast tissue, and physical exams are important after mastectomy for patients that cannot undergo mammography.She has a mammogram scheduled for August.   I encouraged her to call if she had further questions or concerns about her healing. Otherwise, she will follow-up PRN in radiation oncology. Patient is pleased with this plan, and we will cancel her upcoming follow-up to reduce the risk of COVID-19 transmission.

## 2022-08-01 ENCOUNTER — Inpatient Hospital Stay: Payer: Commercial Managed Care - PPO

## 2022-08-01 ENCOUNTER — Inpatient Hospital Stay: Payer: Commercial Managed Care - PPO | Attending: Oncology | Admitting: Oncology

## 2022-08-01 VITALS — BP 136/94 | HR 71 | Temp 98.1°F | Resp 18 | Ht 67.0 in | Wt 132.0 lb

## 2022-08-01 DIAGNOSIS — N951 Menopausal and female climacteric states: Secondary | ICD-10-CM | POA: Insufficient documentation

## 2022-08-01 DIAGNOSIS — D0512 Intraductal carcinoma in situ of left breast: Secondary | ICD-10-CM

## 2022-08-01 DIAGNOSIS — R232 Flushing: Secondary | ICD-10-CM | POA: Insufficient documentation

## 2022-08-01 DIAGNOSIS — Z87442 Personal history of urinary calculi: Secondary | ICD-10-CM | POA: Diagnosis not present

## 2022-08-01 DIAGNOSIS — Z79899 Other long term (current) drug therapy: Secondary | ICD-10-CM | POA: Insufficient documentation

## 2022-08-01 DIAGNOSIS — H16003 Unspecified corneal ulcer, bilateral: Secondary | ICD-10-CM | POA: Insufficient documentation

## 2022-08-01 DIAGNOSIS — M2550 Pain in unspecified joint: Secondary | ICD-10-CM | POA: Diagnosis not present

## 2022-08-01 NOTE — Progress Notes (Signed)
  [name]   Diagnosis: DCIS  INTERVAL HISTORY:   Jennifer Cantu returns as scheduled.  She began Arimidex after completing left breast radiation.  She has noted mild arthralgias since beginning Arimidex.  No other complaint.  She reports she has had a Mirena device in place for years, but feels she is postmenopausal due to hot flashes and the need for estrogen replacement in the past.  Objective:  Vital signs in last 24 hours:  Blood pressure (!) 136/94, pulse 71, temperature 98.1 F (36.7 C), temperature source Oral, resp. rate 18, height '5\' 7"'$  (1.702 m), weight 132 lb (59.9 kg), SpO2 100 %.    Lymphatics: No cervical, supraclavicular, or axillary nodes Resp: Lungs clear bilaterally Cardio: Regular rate and rhythm GI: No hepatosplenomegaly Vascular: No leg edema Breast: No mass in either breast.  Left areola incision without evidence of recurrent tumor.  Minimal radiation hyperpigmentation over the left breast.  Both axillae appear benign.  Lab Results:  Lab Results  Component Value Date   WBC 6.1 01/14/2019   HGB 12.9 01/14/2019   HCT 40.2 01/14/2019   MCV 93.5 01/14/2019   PLT 199 01/14/2019   NEUTROABS 4.1 01/14/2019    CMP  Lab Results  Component Value Date   NA 138 11/24/2018   K 4.5 11/24/2018   CL 103 11/24/2018   CO2 28 11/24/2018   GLUCOSE 97 11/24/2018   BUN 13 11/24/2018   CREATININE 1.27 (H) 11/24/2018   CALCIUM 8.9 11/24/2018   PROT 7.1 11/02/2018   ALBUMIN 4.5 11/02/2018   AST 23 11/02/2018   ALT 16 11/02/2018   ALKPHOS 40 11/02/2018   BILITOT 0.4 11/02/2018   GFRNONAA 49 (L) 11/24/2018   GFRAA 57 (L) 11/24/2018    Medications: I have reviewed the patient's current medications.   Assessment/Plan: Left breast DCIS Diagnostic left mammogram 02/20/2022 ,1.6 cm group of indeterminate microcalcifications over the left upper outer quadrant Stereotactic core biopsy 03/02/2022, DCIS, low-grade, cribriform with  focal necrosis, ER 95%, PR 80% Radioactive seed localized left lumpectomy 04/12/2022-foci of intermediate to high-grade DCIS with calcifications, negative resection margins, no evidence of invasive cancer, fibrocystic change, biopsy site change, closest margin posterior-9 mm Left breast radiation 05/21/2022 - 06/18/2022 Arimidex 07/03/2022 Bilateral corneal erosion G1, P1, 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 completed adjuvant left breast radiation.  She began Arimidex last month.  She appears to be tolerating the Arimidex well.  She will call for increased arthralgias.  We will check an LH, FSH, and estradiol level to be sure she is postmenopausal.  She will continue yearly mammography.  She will return for an office visit in 8 months.  [name], MD  08/01/2022  10:38 AM

## 2022-08-02 ENCOUNTER — Inpatient Hospital Stay: Payer: Commercial Managed Care - PPO | Admitting: Oncology

## 2022-08-03 ENCOUNTER — Encounter: Payer: Self-pay | Admitting: *Deleted

## 2022-08-03 LAB — LUTEINIZING HORMONE: LH: 39.1 m[IU]/mL

## 2022-08-03 LAB — FOLLICLE STIMULATING HORMONE: FSH: 111 m[IU]/mL

## 2022-08-03 LAB — ESTRADIOL, ULTRA SENS: Estradiol, Sensitive: <2.5 pg/mL

## 2023-03-01 ENCOUNTER — Other Ambulatory Visit: Payer: Self-pay | Admitting: Obstetrics and Gynecology

## 2023-03-01 DIAGNOSIS — Z853 Personal history of malignant neoplasm of breast: Secondary | ICD-10-CM

## 2023-03-01 DIAGNOSIS — Z9889 Other specified postprocedural states: Secondary | ICD-10-CM

## 2023-03-12 ENCOUNTER — Ambulatory Visit
Admission: RE | Admit: 2023-03-12 | Discharge: 2023-03-12 | Disposition: A | Discharge status: Home / Self Care | Payer: Commercial Managed Care - PPO | Source: Ambulatory Visit | Attending: Obstetrics and Gynecology | Admitting: Obstetrics and Gynecology

## 2023-03-12 DIAGNOSIS — Z853 Personal history of malignant neoplasm of breast: Secondary | ICD-10-CM

## 2023-03-12 DIAGNOSIS — Z9889 Other specified postprocedural states: Secondary | ICD-10-CM

## 2023-03-12 HISTORY — DX: Personal history of irradiation: Z92.3

## 2023-04-04 ENCOUNTER — Inpatient Hospital Stay: Payer: Commercial Managed Care - PPO

## 2023-04-04 ENCOUNTER — Inpatient Hospital Stay: Payer: Commercial Managed Care - PPO | Admitting: Oncology

## 2023-04-09 ENCOUNTER — Inpatient Hospital Stay: Payer: Commercial Managed Care - PPO

## 2023-04-09 ENCOUNTER — Other Ambulatory Visit (HOSPITAL_BASED_OUTPATIENT_CLINIC_OR_DEPARTMENT_OTHER): Payer: Self-pay

## 2023-04-09 ENCOUNTER — Inpatient Hospital Stay: Payer: Commercial Managed Care - PPO | Attending: Oncology | Admitting: Oncology

## 2023-04-09 VITALS — BP 116/91 | HR 76 | Temp 97.9°F | Resp 18 | Ht 67.0 in | Wt 131.8 lb

## 2023-04-09 DIAGNOSIS — Z79899 Other long term (current) drug therapy: Secondary | ICD-10-CM | POA: Diagnosis not present

## 2023-04-09 DIAGNOSIS — G8929 Other chronic pain: Secondary | ICD-10-CM | POA: Diagnosis not present

## 2023-04-09 DIAGNOSIS — Z87442 Personal history of urinary calculi: Secondary | ICD-10-CM | POA: Diagnosis not present

## 2023-04-09 DIAGNOSIS — D0512 Intraductal carcinoma in situ of left breast: Secondary | ICD-10-CM | POA: Diagnosis present

## 2023-04-09 DIAGNOSIS — M545 Low back pain, unspecified: Secondary | ICD-10-CM | POA: Diagnosis not present

## 2023-04-09 DIAGNOSIS — M810 Age-related osteoporosis without current pathological fracture: Secondary | ICD-10-CM | POA: Diagnosis not present

## 2023-04-09 DIAGNOSIS — R232 Flushing: Secondary | ICD-10-CM | POA: Insufficient documentation

## 2023-04-09 MED ORDER — INFLUENZA VIRUS VACC SPLIT PF (FLUZONE) 0.5 ML IM SUSY
0.5000 mL | PREFILLED_SYRINGE | Freq: Once | INTRAMUSCULAR | 0 refills | Status: AC
  Filled 2023-04-09: qty 0.5, 1d supply, fill #0

## 2023-04-09 MED ORDER — CALCIUM CITRATE PLUS PO TABS: 2.0000 | ORAL_TABLET | Freq: Every day | ORAL | Status: AC

## 2023-04-09 MED ORDER — VITAMIN D3 10 MCG (400 UNIT) PO CAPS: 800.0000 [IU] | ORAL_CAPSULE | Freq: Every day | ORAL | Status: DC

## 2023-04-09 NOTE — Addendum Note (Signed)
Addended by: Wandalee Ferdinand on: 04/09/2023 10:53 AM   Modules accepted: Orders

## 2023-04-09 NOTE — Progress Notes (Signed)
  [facility] OFFICE PROGRESS NOTE   Diagnosis: DCIS  INTERVAL HISTORY:   Jennifer Cantu returns as scheduled.  She continues anastrozole.  She has intermittent hot flashes.  She reports chronic low back pain.  She has been evaluated by neurosurgery and reports receiving a "injection".  She has intermittent discomfort in the left breast. She has osteoporosis.  The bone density was diminished on a recent bone density scan.  She is taking calcium and vitamin D, currently 4000 units of vitamin D daily.  She has not started bone building therapy.  She is exercising. A bilateral mammogram 03/12/2023 revealed no evidence of new or recurrent breast cancer. Objective:  Vital signs in last 24 hours:  Blood pressure (!) 116/91, pulse 76, temperature 97.9 F (36.6 C), temperature source Temporal, resp. rate 18, height 5\' 7"  (1.702 m), weight 131 lb 12.8 oz (59.8 kg), SpO2 100%.     Lymphatics: No cervical, supraclavicular, or axillary nodes Resp: Lungs clear bilaterally Cardio: Regular rate and rhythm GI: No hepatomegaly Vascular: No leg edema Breast: Left lumpectomy, no evidence for local tumor recurrence.  No mass in either breast. Musculoskeletal: Mild tenderness right greater than left posterior iliac  Lab Results:  Lab Results  Component Value Date   WBC 6.1 01/14/2019   HGB 12.9 01/14/2019   HCT 40.2 01/14/2019   MCV 93.5 01/14/2019   PLT 199 01/14/2019   NEUTROABS 4.1 01/14/2019    CMP  Lab Results  Component Value Date   NA 138 11/24/2018   K 4.5 11/24/2018   CL 103 11/24/2018   CO2 28 11/24/2018   GLUCOSE 97 11/24/2018   BUN 13 11/24/2018   CREATININE 1.27 (H) 11/24/2018   CALCIUM 8.9 11/24/2018   PROT 7.1 11/02/2018   ALBUMIN 4.5 11/02/2018   AST 23 11/02/2018   ALT 16 11/02/2018   ALKPHOS 40 11/02/2018   BILITOT 0.4 11/02/2018   GFRNONAA 49 (L) 11/24/2018   GFRAA 57 (L) 11/24/2018    Reviewed the patient's current  medications.   Assessment/Plan: Left breast DCIS Diagnostic left mammogram 02/20/2022 ,1.6 cm group of indeterminate microcalcifications over the left upper outer quadrant Stereotactic core biopsy 03/02/2022, DCIS, low-grade, cribriform with focal necrosis, ER 95%, PR 80% Radioactive seed localized left lumpectomy 04/12/2022-foci of intermediate to high-grade DCIS with calcifications, negative resection margins, no evidence of invasive cancer, fibrocystic change, biopsy site change, closest margin posterior-9 mm Left breast radiation 05/21/2022 - 06/18/2022 Arimidex 07/03/2022 Bilateral corneal erosion G1, P1, menarche 16, child 89, menopause in 2013 followed by estrogen replacement, Mirena in place, discontinued estrogen September 2023 Right kidney stone removed 2020 Osteoporosis     Disposition: Mr Spainhower has a history of DCIS.  She continues adjuvant anastrozole.  She is in clinical remission from breast cancer.  She will continue yearly mammography.  She has been diagnosed with osteoporosis.  We discussed standard recommendations for calcium and vitamin D supplementation (calcium-1200 mg, vitamin D 800 units (in patients with osteoporosis.  I recommend she follow-up with Dr. Vincente Poli and Wayland Denis for management of osteoporosis  Jennifer Cantu will return for an office visit in 1 year.  Thornton Papas, MD  04/09/2023  10:02 AM

## 2023-04-15 ENCOUNTER — Encounter: Payer: Self-pay | Admitting: Nurse Practitioner

## 2023-05-09 ENCOUNTER — Encounter: Payer: Self-pay | Admitting: Obstetrics and Gynecology

## 2023-05-29 NOTE — Telephone Encounter (Signed)
Telephone call  

## 2023-06-15 ENCOUNTER — Other Ambulatory Visit: Payer: Self-pay | Admitting: Oncology

## 2023-08-06 ENCOUNTER — Other Ambulatory Visit: Payer: Self-pay

## 2023-10-07 ENCOUNTER — Other Ambulatory Visit: Payer: Self-pay | Admitting: *Deleted

## 2023-10-07 MED ORDER — ANASTROZOLE 1 MG PO TABS: 1.0000 mg | ORAL_TABLET | Freq: Every day | ORAL | 11 refills | Status: DC

## 2023-10-07 NOTE — Telephone Encounter (Signed)
 Called to request refill on anastrozole and needs 30 day supply. Refill sent.

## 2023-10-09 ENCOUNTER — Other Ambulatory Visit: Payer: Self-pay | Admitting: *Deleted

## 2023-10-16 ENCOUNTER — Other Ambulatory Visit: Payer: Self-pay | Admitting: Obstetrics and Gynecology

## 2023-10-16 DIAGNOSIS — Z9889 Other specified postprocedural states: Secondary | ICD-10-CM

## 2023-10-16 DIAGNOSIS — Z853 Personal history of malignant neoplasm of breast: Secondary | ICD-10-CM

## 2024-02-19 LAB — LAB REPORT - SCANNED
A1c: 5.3
EGFR: 86

## 2024-03-12 ENCOUNTER — Ambulatory Visit
Admission: RE | Admit: 2024-03-12 | Discharge: 2024-03-12 | Disposition: A | Discharge status: Home / Self Care | Source: Ambulatory Visit | Attending: Obstetrics and Gynecology | Admitting: Obstetrics and Gynecology

## 2024-03-12 DIAGNOSIS — Z853 Personal history of malignant neoplasm of breast: Secondary | ICD-10-CM

## 2024-03-12 DIAGNOSIS — Z9889 Other specified postprocedural states: Secondary | ICD-10-CM

## 2024-04-08 ENCOUNTER — Inpatient Hospital Stay

## 2024-04-08 ENCOUNTER — Inpatient Hospital Stay: Payer: Commercial Managed Care - PPO | Attending: Oncology | Admitting: Oncology

## 2024-04-08 VITALS — BP 120/85 | HR 66 | Temp 97.6°F | Resp 18 | Ht 67.0 in | Wt 127.7 lb

## 2024-04-08 DIAGNOSIS — Z23 Encounter for immunization: Secondary | ICD-10-CM | POA: Insufficient documentation

## 2024-04-08 DIAGNOSIS — Z79890 Hormone replacement therapy (postmenopausal): Secondary | ICD-10-CM | POA: Insufficient documentation

## 2024-04-08 DIAGNOSIS — R232 Flushing: Secondary | ICD-10-CM | POA: Diagnosis not present

## 2024-04-08 DIAGNOSIS — Z79899 Other long term (current) drug therapy: Secondary | ICD-10-CM | POA: Diagnosis not present

## 2024-04-08 DIAGNOSIS — N644 Mastodynia: Secondary | ICD-10-CM | POA: Insufficient documentation

## 2024-04-08 DIAGNOSIS — M810 Age-related osteoporosis without current pathological fracture: Secondary | ICD-10-CM | POA: Diagnosis not present

## 2024-04-08 DIAGNOSIS — D0512 Intraductal carcinoma in situ of left breast: Secondary | ICD-10-CM | POA: Diagnosis present

## 2024-04-08 DIAGNOSIS — Z87442 Personal history of urinary calculi: Secondary | ICD-10-CM | POA: Insufficient documentation

## 2024-04-08 DIAGNOSIS — N200 Calculus of kidney: Secondary | ICD-10-CM | POA: Insufficient documentation

## 2024-04-08 DIAGNOSIS — G8929 Other chronic pain: Secondary | ICD-10-CM | POA: Diagnosis not present

## 2024-04-08 MED ORDER — INFLUENZA VIRUS VACC SPLIT PF (FLUZONE) 0.5 ML IM SUSY
0.5000 mL | PREFILLED_SYRINGE | Freq: Once | INTRAMUSCULAR | Status: AC
  Administered 2024-04-08: 0.5 mL via INTRAMUSCULAR
  Filled 2024-04-08: qty 0.5

## 2024-04-08 MED ORDER — ANASTROZOLE 1 MG PO TABS: 1.0000 mg | ORAL_TABLET | Freq: Every day | ORAL | 11 refills | Status: AC

## 2024-04-08 NOTE — Progress Notes (Signed)
  [facility] OFFICE PROGRESS NOTE   Diagnosis: DCIS  INTERVAL HISTORY:   Jennifer Cantu returns as scheduled.  She continues anastrozole .  She has mild hot flashes.  She has chronic low back pain.  No significant arthralgias.  The left breast is sore .  A bilateral mammogram 03/12/2024 was negative.  She continues calcium  and vitamin D for treatment of osteoporosis.  She is exercising.  Objective:  Vital signs in last 24 hours:  Blood pressure 120/85, pulse 66, temperature 97.6 F (36.4 C), temperature source Temporal, resp. rate 18, height 5' 7 (1.702 m), weight 127 lb 11.2 oz (57.9 kg), SpO2 100%.    Lymphatics: No cervical, supraclavicular, or axillary nodes Resp: Lungs clear bilaterally Cardio: Rate and rhythm GI: No hepatosplenomegaly Vascular: No leg edema Breasts: Left lumpectomy.  No evidence for local tumor recurrence.  No mass in either breast. Musculoskeletal: Mild tenderness at the right posterior iliac   Lab Results:  Lab Results  Component Value Date   WBC 6.1 01/14/2019   HGB 12.9 01/14/2019   HCT 40.2 01/14/2019   MCV 93.5 01/14/2019   PLT 199 01/14/2019   NEUTROABS 4.1 01/14/2019    CMP  Lab Results  Component Value Date   NA 138 11/24/2018   K 4.5 11/24/2018   CL 103 11/24/2018   CO2 28 11/24/2018   GLUCOSE 97 11/24/2018   BUN 13 11/24/2018   CREATININE 1.27 (H) 11/24/2018   CALCIUM  8.9 11/24/2018   PROT 7.1 11/02/2018   ALBUMIN 4.5 11/02/2018   AST 23 11/02/2018   ALT 16 11/02/2018   ALKPHOS 40 11/02/2018   BILITOT 0.4 11/02/2018   GFRNONAA 49 (L) 11/24/2018   GFRAA 57 (L) 11/24/2018     Medications: I have reviewed the patient's current medications.   Assessment/Plan: Left breast DCIS Diagnostic left mammogram 02/20/2022 ,1.6 cm group of indeterminate microcalcifications over the left upper outer quadrant Stereotactic core biopsy 03/02/2022, DCIS, low-grade, cribriform with focal necrosis, ER 95%, PR 80% Radioactive  seed localized left lumpectomy 04/12/2022-foci of intermediate to high-grade DCIS with calcifications, negative resection margins, no evidence of invasive cancer, fibrocystic change, biopsy site change, closest margin posterior-9 mm Left breast radiation 05/21/2022 - 06/18/2022 Arimidex  07/03/2022 Bilateral corneal erosion G1, P1, menarche 16, child 38, menopause in 2013 followed by estrogen replacement, Mirena in place, discontinued estrogen September 2023 Right kidney stone removed 2020 Osteoporosis    Disposition: Jennifer Cantu is in clinical remission from the left breast DCIS.  She continues adjuvant Arimidex .  She will continue follow-up with Dr. Mat for management of osteoporosis.  She will continue yearly mammography. Jennifer Cantu will return for an office visit in 1 year.  She received an influenza vaccine today.  Arley Hof, MD  04/08/2024  12:06 PM

## 2024-08-13 ENCOUNTER — Telehealth: Payer: Self-pay | Admitting: Oncology

## 2024-08-13 NOTE — Telephone Encounter (Signed)
 PT called stating that she received a text or call to reschedule appt; checked PT's telephone encounter notes and providers schedule. Don't see any changes let PT know appt is still scheduled.

## 2025-04-08 ENCOUNTER — Ambulatory Visit: Admitting: Oncology
